# Patient Record
Sex: Male | Born: 1970
Health system: Southern US, Community
[De-identification: ages and names within clinical notes are randomized; demographics above are authoritative.]

## PROBLEM LIST (undated history)

## (undated) DIAGNOSIS — G8929 Other chronic pain: Secondary | ICD-10-CM

## (undated) DIAGNOSIS — J302 Other seasonal allergic rhinitis: Secondary | ICD-10-CM

## (undated) DIAGNOSIS — M549 Dorsalgia, unspecified: Secondary | ICD-10-CM

## (undated) DIAGNOSIS — IMO0001 Reserved for inherently not codable concepts without codable children: Secondary | ICD-10-CM

## (undated) DIAGNOSIS — K219 Gastro-esophageal reflux disease without esophagitis: Secondary | ICD-10-CM

## (undated) DIAGNOSIS — E039 Hypothyroidism, unspecified: Secondary | ICD-10-CM

## (undated) HISTORY — DX: Hypothyroidism, unspecified: E03.9

## (undated) HISTORY — PX: MULTIPLE TOOTH EXTRACTIONS: SHX2053

## (undated) HISTORY — PX: VASECTOMY: SHX75

---

## 2011-07-24 ENCOUNTER — Encounter: Payer: Self-pay | Admitting: Internal Medicine

## 2011-07-24 ENCOUNTER — Ambulatory Visit (INDEPENDENT_AMBULATORY_CARE_PROVIDER_SITE_OTHER): Payer: BC Managed Care – PPO | Admitting: Internal Medicine

## 2011-07-24 ENCOUNTER — Ambulatory Visit: Payer: BC Managed Care – PPO

## 2011-07-24 DIAGNOSIS — R0602 Shortness of breath: Secondary | ICD-10-CM

## 2011-07-24 DIAGNOSIS — R079 Chest pain, unspecified: Secondary | ICD-10-CM

## 2011-07-24 MED ORDER — CYCLOBENZAPRINE HCL 10 MG PO TABS: 10.0000 mg | ORAL_TABLET | Freq: Every day | ORAL | Status: AC

## 2011-07-24 MED ORDER — KETOROLAC TROMETHAMINE 60 MG/2ML IM SOLN
60.0000 mg | Freq: Once | INTRAMUSCULAR | Status: AC
  Administered 2011-07-24: 60 mg via INTRAMUSCULAR

## 2011-07-24 MED ORDER — CYCLOBENZAPRINE HCL 10 MG PO TABS: 10.0000 mg | ORAL_TABLET | Freq: Three times a day (TID) | ORAL | Status: DC | PRN

## 2011-07-24 NOTE — Progress Notes (Signed)
  Subjective:    Patient ID: Jeremy Fair., male    DOB: [birth year], 41 y.o.   MRN: [id number]  HPI 41 yo CM c/o B lower thoracic back pain that occurred suddenly this am when he realized he had forgotten something and jumped quickly out of the car to grab it.  Sudden SOB.  No recent new activity.  Had a similar pain 2 days ago at work but it wasn't nearly as intense.  No recent URI, cough, no dental cleaning.  Denies anterior chest pain.  Denies palpitations or paresthesias. No diaphoresis.  No FH of early cardiac or lung problems. Non-smoker.    Review of Systems  All other systems reviewed and are negative.     Objective:   Physical Exam        Assessment & Plan:

## 2011-07-24 NOTE — Progress Notes (Signed)
  Subjective:    Patient ID: Jeremy Fair., male    DOB: 1970/09/05, 41 y.o.   MRN: [id number]  HPIThis man jumped out of his car to run back in the house to get something early this morning and felt a sudden shortness of breath  With sharp pain in the middle of the back. He continues to notice shortness of breath and can recreate the pain with certain movements No palpitations/no diaphoresis/nausea/no increased work of breathing 2 basic he had a strain injury in this same area of his back     Review of SystemsNoncontributory Past medical history-history of ulcer disease now stable History of erectile dysfunction     Objective:   Physical Exam Apprehensive Vital signs normal including pulse ox 98%Except blood pressure 150/92 HEENT is clear The lungs are clear to auscultation The heart has a regular rhythm with No murmurs rubs or gallops There is tenderness in the posterior chest bilaterally in the muscles around T8-T7     [facility] reading (PRIMARY) by  Dr. Josephina Gip acute distress.No pneumothorax    Assessment & Plan:  Problem #1 shortness of breath Problem #2 chest wall pain Problem #3 muscle strain chest wall  Toradol 60 IM/also he has a history of hives with aspirin he is able to take ibuprofen without any problems  Flexeril 10 mg at bedtime  Tylenol as needed/gentle stretching

## 2011-11-02 ENCOUNTER — Other Ambulatory Visit: Payer: Self-pay | Admitting: Physician Assistant

## 2011-11-02 MED ORDER — SILDENAFIL CITRATE 100 MG PO TABS: ORAL_TABLET | ORAL | Status: DC

## 2011-12-02 ENCOUNTER — Other Ambulatory Visit: Payer: Self-pay | Admitting: *Deleted

## 2011-12-02 MED ORDER — SILDENAFIL CITRATE 100 MG PO TABS: ORAL_TABLET | ORAL | Status: DC

## 2012-03-06 ENCOUNTER — Ambulatory Visit (INDEPENDENT_AMBULATORY_CARE_PROVIDER_SITE_OTHER): Payer: BC Managed Care – PPO | Admitting: Family Medicine

## 2012-03-06 ENCOUNTER — Encounter: Payer: Self-pay | Admitting: Family Medicine

## 2012-03-06 VITALS — BP 118/82 | HR 94 | Temp 99.4°F | Resp 16 | Ht 70.0 in | Wt 205.0 lb

## 2012-03-06 DIAGNOSIS — R079 Chest pain, unspecified: Secondary | ICD-10-CM

## 2012-03-06 DIAGNOSIS — I951 Orthostatic hypotension: Secondary | ICD-10-CM

## 2012-03-06 DIAGNOSIS — R002 Palpitations: Secondary | ICD-10-CM

## 2012-03-06 NOTE — Progress Notes (Signed)
Patient ID: Chalmers Poyser., male   DOB: November 30, 1970, 41 y.o.   MRN: [id number] Jeremy Atkins. is a 41 y.o. male who presents to Urgent Care today for palpitations:  1.  Palpitations:  Present since working out several hours earlier today.  Was on elliptical machine for 30 minutes as he does 3 times a week but when he stepped off he felt his heart rate never returned to normal.  Began feeling bad when he started weight lifting.  Performed several reps and then had to stop because he felt bad.  Went home with girlfriend and sat back in his chair and began experiencing palpitations.  He stood up and experienced some lightheadedness, and at that point he came to UC to be evaluated.    Still having some subjective feelings of palpitations, even during our interview and examination.  States he drinks 2-4 cups of coffee daily but had 6 today.  Had some water while working out and that was all the water or fluid intake he had today.  No chest pain.  No shortness of breath.  No diaphoresis or nausea.    PMH reviewed.  ROS as above otherwise neg.  Medications reviewed.  Viagra lasted, but states he hasn't taken this in almost a year.   Current Outpatient Prescriptions  Medication Sig Dispense Refill  . sildenafil (VIAGRA) 100 MG tablet Take 1/2-1 1 hour prior to stimulation prn  5 tablet  4    Exam:  BP 118/82  Pulse 94  Temp 99.4 F (37.4 C) (Oral)  Resp 16  Ht 5\' 10"  (1.778 m)  Wt 205 lb (92.987 kg)  BMI 29.41 kg/m2  SpO2 98% Gen: Well NAD HEENT: EOMI.  Dry mucus membranes.   Lungs: CTABL Nl WOB Heart: RRR no MRG.  Manual pulse rate 88.   Abd: NABS, NT, ND Exts: Non edematous BL  LE, warm and well perfused.   EKG:  Normal sinus rhythm  ** Please note that "chest pain" was put in as visit diagnosis -- however clinic staff placed that associated diagnosis and not myself.  Patient was NOT having any chest pain.  EKG should have been performed with associated diagnosis of palpitations.     Assessment and Plan:  1.  Palpitations:  Resolved.  No red flags.  I reviewed last visit here, where pulse rate was in 60s and he was hypertensive.  He works out regularly and I suspect he has some relative tachycardia today.   Likely combination of caffeine and decreased fluid intake.  Recommended to increase fluid intake and return if palpitations return, or he begins to experience SOB or chest pain.  Recommended decrease caffeine intake.

## 2012-03-06 NOTE — Patient Instructions (Addendum)
Your EKG looks good.  I think you got dehydrated today.    If your heart starts racing again, come back and let us know.  If you start having chest pain, trouble breathing, or lightheadedness come back and see [place].    It was good to meet you today

## 2012-03-10 NOTE — Progress Notes (Signed)
I have read, reviewed, and agree with plan of care by Dr. Walden  

## 2013-05-01 ENCOUNTER — Ambulatory Visit (INDEPENDENT_AMBULATORY_CARE_PROVIDER_SITE_OTHER): Payer: BC Managed Care – PPO | Admitting: Emergency Medicine

## 2013-05-01 VITALS — BP 130/90 | HR 63 | Temp 98.0°F | Resp 18 | Ht 68.75 in | Wt 215.4 lb

## 2013-05-01 DIAGNOSIS — M549 Dorsalgia, unspecified: Secondary | ICD-10-CM

## 2013-05-01 DIAGNOSIS — S335XXA Sprain of ligaments of lumbar spine, initial encounter: Secondary | ICD-10-CM

## 2013-05-01 LAB — POCT UA - MICROSCOPIC ONLY
Casts, Ur, LPF, POC: NEGATIVE
Epithelial cells, urine per micros: NEGATIVE
Mucus, UA: NEGATIVE
Yeast, UA: NEGATIVE

## 2013-05-01 LAB — POCT URINALYSIS DIPSTICK
Ketones, UA: NEGATIVE
Protein, UA: NEGATIVE
Spec Grav, UA: 1.01
pH, UA: 7

## 2013-05-01 MED ORDER — CYCLOBENZAPRINE HCL 10 MG PO TABS: 10.0000 mg | ORAL_TABLET | Freq: Three times a day (TID) | ORAL | Status: DC | PRN

## 2013-05-01 MED ORDER — TRAMADOL HCL 50 MG PO TABS: 50.0000 mg | ORAL_TABLET | Freq: Three times a day (TID) | ORAL | Status: DC | PRN

## 2013-05-01 NOTE — Patient Instructions (Signed)
Back Pain, Adult Low back pain is very common. About 1 in 5 people have back pain.The cause of low back pain is rarely dangerous. The pain often gets better over time.About half of people with a sudden onset of back pain feel better in just 2 weeks. About 8 in 10 people feel better by 6 weeks.  CAUSES Some common causes of back pain include:  Strain of the muscles or ligaments supporting the spine.  Wear and tear (degeneration) of the spinal discs.  Arthritis.  Direct injury to the back. DIAGNOSIS Most of the time, the direct cause of low back pain is not known.However, back pain can be treated effectively even when the exact cause of the pain is unknown.Answering your caregiver's questions about your overall health and symptoms is one of the most accurate ways to make sure the cause of your pain is not dangerous. If your caregiver needs more information, he or she may order lab work or imaging tests (X-rays or MRIs).However, even if imaging tests show changes in your back, this usually does not require surgery. HOME CARE INSTRUCTIONS For many people, back pain returns.Since low back pain is rarely dangerous, it is often a condition that people can learn to manageon their own.   Remain active. It is stressful on the back to sit or stand in one place. Do not sit, drive, or stand in one place for more than 30 minutes at a time. Take short walks on level surfaces as soon as pain allows.Try to increase the length of time you walk each day.  Do not stay in bed.Resting more than 1 or 2 days can delay your recovery.  Do not avoid exercise or work.Your body is made to move.It is not dangerous to be active, even though your back may hurt.Your back will likely heal faster if you return to being active before your pain is gone.  Pay attention to your body when you bend and lift. Many people have less discomfortwhen lifting if they bend their knees, keep the load close to their bodies,and  avoid twisting. Often, the most comfortable positions are those that put less stress on your recovering back.  Find a comfortable position to sleep. Use a firm mattress and lie on your side with your knees slightly bent. If you lie on your back, put a pillow under your knees.  Only take over-the-counter or prescription medicines as directed by your caregiver. Over-the-counter medicines to reduce pain and inflammation are often the most helpful.Your caregiver may prescribe muscle relaxant drugs.These medicines help dull your pain so you can more quickly return to your normal activities and healthy exercise.  Put ice on the injured area.  Put ice in a plastic bag.  Place a towel between your skin and the bag.  Leave the ice on for 15-20 minutes, 03-04 times a day for the first 2 to 3 days. After that, ice and heat may be alternated to reduce pain and spasms.  Ask your caregiver about trying back exercises and gentle massage. This may be of some benefit.  Avoid feeling anxious or stressed.Stress increases muscle tension and can worsen back pain.It is important to recognize when you are anxious or stressed and learn ways to manage it.Exercise is a great option. SEEK MEDICAL CARE IF:  You have pain that is not relieved with rest or medicine.  You have pain that does not improve in 1 week.  You have new symptoms.  You are generally not feeling well. SEEK   IMMEDIATE MEDICAL CARE IF:   You have pain that radiates from your back into your legs.  You develop new bowel or bladder control problems.  You have unusual weakness or numbness in your arms or legs.  You develop nausea or vomiting.  You develop abdominal pain.  You feel faint. Document Released: 05/10/2005 Document Revised: 11/09/2011 Document Reviewed: 09/28/2010 [employer] Patient Information 2014 [employer].  

## 2013-05-01 NOTE — Progress Notes (Signed)
[facility] [address] [place] [address] [phone]  Date:  05/01/2013   Name:  Jeremy Atkins.   DOB:  16-Jun-1970   MRN:  [id number]  PCP:  Tonye Pearson, MD    Chief Complaint: Back Pain and Headache   History of Present Illness:  Jeremy Atkins. is a 42 y.o. very pleasant male patient who presents with the following:  Sudden onset of left mid back pain when he went to his truck.  Says the pain sent him to his knees.  Over the weekend he helped carry a treadmill up the stairs and had some low back pain following that was not radiating and has resolved.  The pain today is pleuritic and associated with rotation of his upper trunk.  Pain is non radiating.  He has no history of kidney stone and no GU symptoms.  Denies cough, wheezing or shortness of breath.  No hemoptysis.  No nausea or vomiting. Pain is worse with movement and improves when sits quietly.  No fever or chills.  No travel, surgery or immobilization.  Non smoker.  Denies other complaint or health concern today.   There are no active problems to display for this patient.   History reviewed. No pertinent past medical history.  History reviewed. No pertinent past surgical history.  History  Substance Use Topics  . Smoking status: Never Smoker   . Smokeless tobacco: Never Used  . Alcohol Use: No    Family History  Problem Relation Age of Onset  . Hyperlipidemia Mother   . Hyperlipidemia Father     Allergies  Allergen Reactions  . Aspirin Hives    Medication list has been reviewed and updated.  Current Outpatient Prescriptions on File Prior to Visit  Medication Sig Dispense Refill  . sildenafil (VIAGRA) 100 MG tablet Take 1/2-1 1 hour prior to stimulation prn  5 tablet  4   No current facility-administered medications on file prior to visit.    Review of Systems:  As per HPI, otherwise negative.    Physical Examination: Filed Vitals:   05/01/13 1037  BP:  130/90  Pulse: 63  Temp: 98 F (36.7 C)  Resp: 18   Filed Vitals:   05/01/13 1037  Height: 5' 8.75" (1.746 m)  Weight: 215 lb 6.4 oz (97.705 kg)   Body mass index is 32.05 kg/(m^2). Ideal Body Weight: Weight in (lb) to have BMI = 25: 167.7  GEN: WDWN, NAD, Non-toxic, A & O x 3 HEENT: Atraumatic, Normocephalic. Neck supple. No masses, No LAD. Ears and Nose: No external deformity. CV: RRR, No M/G/R. No JVD. No thrill. No extra heart sounds. PULM: CTA B, no wheezes, crackles, rhonchi. No retractions. No resp. distress. No accessory muscle use. ABD: S, NT, ND, +BS. No rebound. No HSM. EXTR: No c/c/e NEURO Normal gait.  PSYCH: Normally interactive. Conversant. Not depressed or anxious appearing.  Calm demeanor.    Assessment and Plan: Back strain Anaprox Flexeril   Signed,  Phillips Odor, MD   Results for orders placed in visit on 05/01/13  POCT URINALYSIS DIPSTICK      Result Value Range   Color, UA light yellow     Clarity, UA clear     Glucose, UA neg     Bilirubin, UA neg     Ketones, UA neg     Spec Grav, UA 1.010     Blood, UA neg     pH, UA 7.0  Protein, UA neg     Urobilinogen, UA 0.2     Nitrite, UA neg     Leukocytes, UA Negative    POCT UA - MICROSCOPIC ONLY      Result Value Range   WBC, Ur, HPF, POC neg     RBC, urine, microscopic neg     Bacteria, U Microscopic neg     Mucus, UA neg     Epithelial cells, urine per micros neg     Crystals, Ur, HPF, POC neg     Casts, Ur, LPF, POC neg     Yeast, UA neg

## 2014-05-13 DIAGNOSIS — F988 Other specified behavioral and emotional disorders with onset usually occurring in childhood and adolescence: Secondary | ICD-10-CM | POA: Insufficient documentation

## 2014-06-05 ENCOUNTER — Encounter: Payer: BC Managed Care – PPO | Admitting: Internal Medicine

## 2014-11-04 ENCOUNTER — Ambulatory Visit (INDEPENDENT_AMBULATORY_CARE_PROVIDER_SITE_OTHER): Payer: 59 | Admitting: Family Medicine

## 2014-11-04 VITALS — BP 118/84 | HR 67 | Temp 98.7°F | Resp 17 | Ht 60.0 in | Wt 213.6 lb

## 2014-11-04 DIAGNOSIS — R05 Cough: Secondary | ICD-10-CM | POA: Diagnosis not present

## 2014-11-04 DIAGNOSIS — H6122 Impacted cerumen, left ear: Secondary | ICD-10-CM

## 2014-11-04 DIAGNOSIS — R059 Cough, unspecified: Secondary | ICD-10-CM

## 2014-11-04 DIAGNOSIS — J209 Acute bronchitis, unspecified: Secondary | ICD-10-CM

## 2014-11-04 MED ORDER — HYDROCODONE-HOMATROPINE 5-1.5 MG/5ML PO SYRP: 5.0000 mL | ORAL_SOLUTION | Freq: Three times a day (TID) | ORAL | Status: DC | PRN

## 2014-11-04 MED ORDER — AZITHROMYCIN 250 MG PO TABS: ORAL_TABLET | ORAL | Status: DC

## 2014-11-04 NOTE — Progress Notes (Signed)
This is a 44 year old generally healthy individual with a nighttime cough for the last 10 days. He had some nighttime sweats 2 nights ago but no definite fever during this time and he's had no further problems with sweats.  Patient has no smoking history and no history of asthma.  Patient says that his cough is somewhat productive at night and does have some sinus congestion. He's also had some mild nausea. He notes his ears have been popping.  Patient works as a Government social research officer.  Objective:BP 118/84 mmHg  Pulse 67  Temp(Src) 98.7 F (37.1 C) (Oral)  Resp 17  Ht 5' (1.524 m)  Wt 213 lb 9.6 oz (96.888 kg)  BMI 41.72 kg/m2  SpO2 98% Gen. appearance is that of a healthy individual in no acute distress. HEENT: Cerumen impaction left ear, otherwise unremarkable Chest: Few rales in the right base otherwise clear Heart: Regular no murmur, no gallop, rhythm regular and regular rate. Extremities: Unremarkable with no edema Skin: No rash  Left ear cerumen was lavaged clear  Assessment: Bronchitis, acute This chart was scribed in my presence and reviewed by me personally.    ICD-9-CM ICD-10-CM   1. Acute bronchitis, unspecified organism 466.0 J20.9 azithromycin (ZITHROMAX Z-PAK) 250 MG tablet     HYDROcodone-homatropine (HYCODAN) 5-1.5 MG/5ML syrup  2. Cough 786.2 R05 HYDROcodone-homatropine (HYCODAN) 5-1.5 MG/5ML syrup  3. Cerumen impaction, left 380.4 H61.22      Signed, Robyn Haber, MD

## 2014-11-04 NOTE — Patient Instructions (Signed)

## 2014-11-06 ENCOUNTER — Telehealth: Payer: Self-pay

## 2014-11-06 NOTE — Telephone Encounter (Signed)
Pt states the medication he was given isn't helping and would like to know if he can get something else or increase the dosage. Please call [phone]     [facility]

## 2014-11-07 MED ORDER — PREDNISONE 20 MG PO TABS: ORAL_TABLET | ORAL | Status: DC

## 2014-11-07 NOTE — Telephone Encounter (Signed)
Assessment: Bronchitis, acute This chart was scribed in my presence and reviewed by me personally.    ICD-9-CM ICD-10-CM   1. Acute bronchitis, unspecified organism 466.0 J20.9 azithromycin (ZITHROMAX Z-PAK) 250 MG tablet     HYDROcodone-homatropine (HYCODAN) 5-1.5 MG/5ML syrup  2. Cough 786.2 R05 HYDROcodone-homatropine (HYCODAN) 5-1.5 MG/5ML syrup  3. Cerumen impaction, left 380.4 H61.22      Signed, Robyn Haber, MD

## 2014-11-07 NOTE — Telephone Encounter (Signed)
Called pt to let him know Rx sent in. Left message on voicemail.

## 2014-12-24 ENCOUNTER — Other Ambulatory Visit: Payer: Self-pay | Admitting: Orthopedic Surgery

## 2014-12-24 DIAGNOSIS — M79671 Pain in right foot: Secondary | ICD-10-CM

## 2014-12-27 ENCOUNTER — Ambulatory Visit
Admission: RE | Admit: 2014-12-27 | Discharge: 2014-12-27 | Disposition: A | Discharge status: Home / Self Care | Payer: 59 | Source: Ambulatory Visit | Attending: Orthopedic Surgery | Admitting: Orthopedic Surgery

## 2014-12-27 DIAGNOSIS — M79671 Pain in right foot: Secondary | ICD-10-CM

## 2015-01-09 ENCOUNTER — Ambulatory Visit: Payer: Worker's Compensation

## 2015-01-09 ENCOUNTER — Ambulatory Visit (INDEPENDENT_AMBULATORY_CARE_PROVIDER_SITE_OTHER): Payer: Worker's Compensation | Admitting: Emergency Medicine

## 2015-01-09 VITALS — BP 112/70 | HR 56 | Temp 98.6°F | Resp 20 | Ht 70.0 in | Wt 214.0 lb

## 2015-01-09 DIAGNOSIS — M545 Low back pain, unspecified: Secondary | ICD-10-CM

## 2015-01-09 DIAGNOSIS — M542 Cervicalgia: Secondary | ICD-10-CM

## 2015-01-09 DIAGNOSIS — S161XXA Strain of muscle, fascia and tendon at neck level, initial encounter: Secondary | ICD-10-CM

## 2015-01-09 MED ORDER — TRAMADOL HCL 50 MG PO TABS
50.0000 mg | ORAL_TABLET | Freq: Three times a day (TID) | ORAL | Status: DC | PRN
Start: 2015-01-09 — End: 2015-01-20

## 2015-01-09 MED ORDER — CYCLOBENZAPRINE HCL 5 MG PO TABS: 5.0000 mg | ORAL_TABLET | Freq: Three times a day (TID) | ORAL | Status: DC | PRN

## 2015-01-09 MED ORDER — NAPROXEN SODIUM 550 MG PO TABS: 550.0000 mg | ORAL_TABLET | Freq: Two times a day (BID) | ORAL | Status: DC

## 2015-01-09 NOTE — Patient Instructions (Signed)
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
## 2015-01-13 ENCOUNTER — Other Ambulatory Visit (HOSPITAL_COMMUNITY): Payer: Self-pay | Admitting: Orthopedic Surgery

## 2015-01-13 ENCOUNTER — Telehealth: Payer: Self-pay

## 2015-01-13 NOTE — Telephone Encounter (Signed)
Patient requesting copy of xpray neck / back [phone]

## 2015-01-14 NOTE — Telephone Encounter (Signed)
Patient picked up x-ray this AM 01/14/15

## 2015-01-21 ENCOUNTER — Other Ambulatory Visit (HOSPITAL_COMMUNITY): Payer: Self-pay | Admitting: Orthopedic Surgery

## 2015-01-21 ENCOUNTER — Ambulatory Visit (HOSPITAL_COMMUNITY)
Admission: RE | Admit: 2015-01-21 | Discharge: 2015-01-21 | Disposition: A | Discharge status: Home / Self Care | Payer: 59 | Source: Ambulatory Visit | Attending: Orthopedic Surgery | Admitting: Orthopedic Surgery

## 2015-01-21 ENCOUNTER — Encounter (HOSPITAL_COMMUNITY): Payer: Self-pay

## 2015-01-21 ENCOUNTER — Encounter (HOSPITAL_COMMUNITY)
Admission: RE | Admit: 2015-01-21 | Discharge: 2015-01-21 | Disposition: A | Discharge status: Home / Self Care | Payer: 59 | Source: Ambulatory Visit | Attending: Orthopedic Surgery | Admitting: Orthopedic Surgery

## 2015-01-21 DIAGNOSIS — S92251A Displaced fracture of navicular [scaphoid] of right foot, initial encounter for closed fracture: Secondary | ICD-10-CM | POA: Diagnosis not present

## 2015-01-21 DIAGNOSIS — Z01812 Encounter for preprocedural laboratory examination: Secondary | ICD-10-CM | POA: Insufficient documentation

## 2015-01-21 DIAGNOSIS — Z01810 Encounter for preprocedural cardiovascular examination: Secondary | ICD-10-CM | POA: Insufficient documentation

## 2015-01-21 DIAGNOSIS — X58XXXA Exposure to other specified factors, initial encounter: Secondary | ICD-10-CM | POA: Insufficient documentation

## 2015-01-21 DIAGNOSIS — Z87891 Personal history of nicotine dependence: Secondary | ICD-10-CM | POA: Diagnosis not present

## 2015-01-21 DIAGNOSIS — Z01818 Encounter for other preprocedural examination: Secondary | ICD-10-CM | POA: Diagnosis not present

## 2015-01-21 DIAGNOSIS — IMO0001 Reserved for inherently not codable concepts without codable children: Secondary | ICD-10-CM

## 2015-01-21 HISTORY — DX: Other seasonal allergic rhinitis: J30.2

## 2015-01-21 HISTORY — DX: Other chronic pain: G89.29

## 2015-01-21 HISTORY — DX: Gastro-esophageal reflux disease without esophagitis: K21.9

## 2015-01-21 HISTORY — DX: Dorsalgia, unspecified: M54.9

## 2015-01-21 HISTORY — DX: Reserved for inherently not codable concepts without codable children: IMO0001

## 2015-01-21 LAB — CBC
HEMATOCRIT: 42.1 % (ref 39.0–52.0)
Hemoglobin: 14.8 g/dL (ref 13.0–17.0)
MCH: 31.7 pg (ref 26.0–34.0)
MCHC: 35.2 g/dL (ref 30.0–36.0)
MCV: 90.1 fL (ref 78.0–100.0)
PLATELETS: 194 10*3/uL (ref 150–400)
RBC: 4.67 MIL/uL (ref 4.22–5.81)
RDW: 12.6 % (ref 11.5–15.5)
WBC: 4.3 10*3/uL (ref 4.0–10.5)

## 2015-01-21 LAB — COMPREHENSIVE METABOLIC PANEL
ALBUMIN: 4.1 g/dL (ref 3.5–5.0)
ALT: 28 U/L (ref 17–63)
AST: 26 U/L (ref 15–41)
Alkaline Phosphatase: 60 U/L (ref 38–126)
Anion gap: 7 (ref 5–15)
BILIRUBIN TOTAL: 0.7 mg/dL (ref 0.3–1.2)
BUN: 15 mg/dL (ref 6–20)
CHLORIDE: 107 mmol/L (ref 101–111)
CO2: 23 mmol/L (ref 22–32)
Calcium: 9.1 mg/dL (ref 8.9–10.3)
Creatinine, Ser: 0.98 mg/dL (ref 0.61–1.24)
GFR calc Af Amer: >60 mL/min (ref >60)
GFR calc non Af Amer: >60 mL/min (ref >60)
GLUCOSE: 103 mg/dL — AB (ref 65–99)
POTASSIUM: 3.8 mmol/L (ref 3.5–5.1)
SODIUM: 137 mmol/L (ref 135–145)
Total Protein: 6.3 g/dL — ABNORMAL LOW (ref 6.5–8.1)

## 2015-01-21 LAB — PROTIME-INR
INR: 1.07 (ref 0.00–1.49)
Prothrombin Time: 14.1 seconds (ref 11.6–15.2)

## 2015-01-21 LAB — SURGICAL PCR SCREEN
MRSA, PCR: POSITIVE — AB
Staphylococcus aureus: POSITIVE — AB

## 2015-01-21 LAB — APTT: APTT: 30 s (ref 24–37)

## 2015-01-21 NOTE — Pre-Procedure Instructions (Addendum)
Ulysses Rivkin Jr.  01/21/2015      [id number] [place], [place] [address] [address] Phone: [phone] Fax: [phone]  [facility] [address], [place] [address] [place] [address] Phone: [phone] Fax: [phone]  [facility] [place] [place] [address] [place] [address] Phone: [phone] Fax: [phone]    Your procedure is scheduled on Friday, January 24, 2015  Report to [facility] Admitting at 10:15 A.M.  Call this number if you have problems the morning of surgery:  [phone]   Remember:  Do not eat food or drink liquids after midnight Thursday, January 23, 2015  Take these medicines the morning of surgery with A SIP OF WATER: if needed: [place]  Stop taking Aspirin, vitamins and herbal medications. Do not take any NSAIDs ie: Ibuprofen, Advil, Naproxen or any medication containing Aspirin; stop now.  Do not wear jewelry, make-up or nail polish.  Do not wear lotions, powders, or perfumes.  You may not wear deodorant.  Do not shave 48 hours prior to surgery.  Men may shave face and neck.  Do not bring valuables to the hospital.  [facility] is not responsible for any belongings or valuables.  Contacts, dentures or bridgework may not be worn into surgery.  Leave your suitcase in the car.  After surgery it may be brought to your room.  For patients admitted to the hospital, discharge time will be determined by your treatment team.  Patients discharged the day of surgery will not be allowed to drive home.   Name and phone number of your driver:   Special instructions:  Special Instructions:Special Instructions: [facility] - Preparing for Surgery  Before surgery, you can play an important role.  Because skin is not sterile, your skin needs to be as free of germs as possible.  You can reduce the  number of germs on you skin by washing with CHG (chlorahexidine gluconate) soap before surgery.  CHG is an antiseptic cleaner which kills germs and bonds with the skin to continue killing germs even after washing.  Please DO NOT use if you have an allergy to CHG or antibacterial soaps.  If your skin becomes reddened/irritated stop using the CHG and inform your nurse when you arrive at Short Stay.  Do not shave (including legs and underarms) for at least 48 hours prior to the first CHG shower.  You may shave your face.  Please follow these instructions carefully:   1.  Shower with CHG Soap the night before surgery and the morning of Surgery.  2.  If you choose to wash your hair, wash your hair first as usual with your normal shampoo.  3.  After you shampoo, rinse your hair and body thoroughly to remove the Shampoo.  4.  Use CHG as you would any other liquid soap.  You can apply chg directly  to the skin and wash gently with scrungie or a clean washcloth.  5.  Apply the CHG Soap to your body ONLY FROM THE NECK DOWN.  Do not use on open wounds or open sores.  Avoid contact with your eyes, ears, mouth and genitals (private parts).  Wash genitals (private parts) with your normal soap.  6.  Wash thoroughly, paying special attention to the area where your surgery will be performed.  7.  Thoroughly rinse your body  with warm water from the neck down.  8.  DO NOT shower/wash with your normal soap after using and rinsing off the CHG Soap.  9.  Pat yourself dry with a clean towel.            10.  Wear clean pajamas.            11.  Place clean sheets on your bed the night of your first shower and do not sleep with pets.  Day of Surgery  Do not apply any lotions/deodorants the morning of surgery.  Please wear clean clothes to the hospital/surgery center.  Please read over the following fact sheets that you were given. Pain Booklet, Coughing and Deep Breathing, MRSA Information and Surgical Site Infection  Prevention

## 2015-01-21 NOTE — Progress Notes (Signed)
Pt denies SOB, chest pain, and being under the care of a cardiologist. Pt denies having a stress test, echo and cardiac cath. Pt denies having a chest x ray and EKG within the last year. 

## 2015-01-24 ENCOUNTER — Ambulatory Visit (HOSPITAL_COMMUNITY): Payer: 59 | Admitting: Certified Registered Nurse Anesthetist

## 2015-01-24 ENCOUNTER — Ambulatory Visit (HOSPITAL_COMMUNITY)
Admission: RE | Admit: 2015-01-24 | Discharge: 2015-01-24 | Disposition: A | Discharge status: Home / Self Care | Payer: 59 | Source: Ambulatory Visit | Attending: Orthopedic Surgery | Admitting: Orthopedic Surgery

## 2015-01-24 ENCOUNTER — Encounter (HOSPITAL_COMMUNITY): Payer: Self-pay | Admitting: *Deleted

## 2015-01-24 ENCOUNTER — Encounter (HOSPITAL_COMMUNITY)
Admission: RE | Disposition: A | Discharge status: Home / Self Care | Payer: Self-pay | Source: Ambulatory Visit | Attending: Orthopedic Surgery

## 2015-01-24 DIAGNOSIS — M84474A Pathological fracture, right foot, initial encounter for fracture: Secondary | ICD-10-CM | POA: Insufficient documentation

## 2015-01-24 DIAGNOSIS — S92251P Displaced fracture of navicular [scaphoid] of right foot, subsequent encounter for fracture with malunion: Secondary | ICD-10-CM

## 2015-01-24 HISTORY — PX: ANKLE FUSION: SHX5718

## 2015-01-24 SURGERY — ANKLE FUSION
Anesthesia: Monitor Anesthesia Care | Site: Foot | Laterality: Right

## 2015-01-24 MED ORDER — PROPOFOL 10 MG/ML IV BOLUS
INTRAVENOUS | Status: AC
  Filled 2015-01-24: qty 20

## 2015-01-24 MED ORDER — CHLORHEXIDINE GLUCONATE 4 % EX LIQD: 60.0000 mL | Freq: Once | CUTANEOUS | Status: DC

## 2015-01-24 MED ORDER — BUPIVACAINE-EPINEPHRINE (PF) 0.5% -1:200000 IJ SOLN
INTRAMUSCULAR | Status: DC | PRN
  Administered 2015-01-24: 50 mL

## 2015-01-24 MED ORDER — ACETAMINOPHEN 325 MG PO TABS: 325.0000 mg | ORAL_TABLET | ORAL | Status: DC | PRN

## 2015-01-24 MED ORDER — OXYCODONE HCL 5 MG PO TABS: 5.0000 mg | ORAL_TABLET | Freq: Once | ORAL | Status: DC | PRN

## 2015-01-24 MED ORDER — MIDAZOLAM HCL 2 MG/2ML IJ SOLN
INTRAMUSCULAR | Status: AC
  Filled 2015-01-24: qty 2

## 2015-01-24 MED ORDER — PROPOFOL 10 MG/ML IV BOLUS
INTRAVENOUS | Status: DC | PRN
  Administered 2015-01-24: 200 mg via INTRAVENOUS

## 2015-01-24 MED ORDER — MIDAZOLAM HCL 2 MG/2ML IJ SOLN
INTRAMUSCULAR | Status: AC
  Filled 2015-01-24: qty 4

## 2015-01-24 MED ORDER — FENTANYL CITRATE (PF) 100 MCG/2ML IJ SOLN
INTRAMUSCULAR | Status: DC | PRN
  Administered 2015-01-24: 100 ug via INTRAVENOUS

## 2015-01-24 MED ORDER — OXYCODONE-ACETAMINOPHEN 5-325 MG PO TABS: 1.0000 | ORAL_TABLET | ORAL | Status: DC | PRN

## 2015-01-24 MED ORDER — LACTATED RINGERS IV SOLN
INTRAVENOUS | Status: DC
  Administered 2015-01-24: 12:00:00 via INTRAVENOUS

## 2015-01-24 MED ORDER — ROCURONIUM BROMIDE 50 MG/5ML IV SOLN
INTRAVENOUS | Status: AC
  Filled 2015-01-24: qty 1

## 2015-01-24 MED ORDER — ACETAMINOPHEN 160 MG/5ML PO SOLN
325.0000 mg | ORAL | Status: DC | PRN
  Filled 2015-01-24: qty 20.3

## 2015-01-24 MED ORDER — ONDANSETRON HCL 4 MG/2ML IJ SOLN
INTRAMUSCULAR | Status: AC
  Filled 2015-01-24: qty 2

## 2015-01-24 MED ORDER — 0.9 % SODIUM CHLORIDE (POUR BTL) OPTIME
TOPICAL | Status: DC | PRN
  Administered 2015-01-24: 1000 mL

## 2015-01-24 MED ORDER — CEFAZOLIN SODIUM-DEXTROSE 2-3 GM-% IV SOLR
2.0000 g | INTRAVENOUS | Status: AC
  Administered 2015-01-24: 2 g via INTRAVENOUS
  Filled 2015-01-24: qty 50

## 2015-01-24 MED ORDER — OXYCODONE HCL 5 MG/5ML PO SOLN: 5.0000 mg | Freq: Once | ORAL | Status: DC | PRN

## 2015-01-24 MED ORDER — ARTIFICIAL TEARS OP OINT
TOPICAL_OINTMENT | OPHTHALMIC | Status: AC
  Filled 2015-01-24: qty 3.5

## 2015-01-24 MED ORDER — MUPIROCIN 2 % EX OINT
TOPICAL_OINTMENT | CUTANEOUS | Status: AC
  Filled 2015-01-24: qty 22

## 2015-01-24 MED ORDER — FENTANYL CITRATE (PF) 250 MCG/5ML IJ SOLN
INTRAMUSCULAR | Status: AC
  Filled 2015-01-24: qty 5

## 2015-01-24 MED ORDER — FENTANYL CITRATE (PF) 100 MCG/2ML IJ SOLN
INTRAMUSCULAR | Status: AC
  Filled 2015-01-24: qty 2

## 2015-01-24 MED ORDER — MUPIROCIN 2 % EX OINT: 1.0000 "application " | TOPICAL_OINTMENT | Freq: Once | CUTANEOUS | Status: DC

## 2015-01-24 MED ORDER — MIDAZOLAM HCL 5 MG/5ML IJ SOLN
INTRAMUSCULAR | Status: DC | PRN
  Administered 2015-01-24 (×2): 2 mg via INTRAVENOUS

## 2015-01-24 MED ORDER — HYDROMORPHONE HCL 1 MG/ML IJ SOLN: 0.2500 mg | INTRAMUSCULAR | Status: DC | PRN

## 2015-01-24 MED ORDER — PROPOFOL INFUSION 10 MG/ML OPTIME
INTRAVENOUS | Status: DC | PRN
  Administered 2015-01-24: 50 ug/kg/min via INTRAVENOUS

## 2015-01-24 MED ORDER — PROPOFOL 10 MG/ML IV BOLUS
INTRAVENOUS | Status: AC
Start: 2015-01-24 — End: 2015-01-24
  Filled 2015-01-24: qty 20

## 2015-01-24 SURGICAL SUPPLY — 42 items
BANDAGE ESMARK 6X9 LF (GAUZE/BANDAGES/DRESSINGS) ×1 IMPLANT
BIT DRILL CANN 3.2 (BIT) ×1 IMPLANT
BLADE SAW SGTL HD 18.5X60.5X1. (BLADE) ×2 IMPLANT
BLADE SURG 10 STRL SS (BLADE) IMPLANT
BNDG COHESIVE 4X5 TAN STRL (GAUZE/BANDAGES/DRESSINGS) ×2 IMPLANT
BNDG COHESIVE 6X5 TAN STRL LF (GAUZE/BANDAGES/DRESSINGS) ×2 IMPLANT
BNDG ESMARK 6X9 LF (GAUZE/BANDAGES/DRESSINGS) ×2
BNDG GAUZE ELAST 4 BULKY (GAUZE/BANDAGES/DRESSINGS) ×2 IMPLANT
COVER MAYO STAND STRL (DRAPES) IMPLANT
COVER SURGICAL LIGHT HANDLE (MISCELLANEOUS) ×4 IMPLANT
DRAPE OEC MINIVIEW 54X84 (DRAPES) IMPLANT
DRAPE U-SHAPE 47X51 STRL (DRAPES) ×2 IMPLANT
DRILL BIT CANN 3.2 (BIT) ×2
DRSG ADAPTIC 3X8 NADH LF (GAUZE/BANDAGES/DRESSINGS) ×2 IMPLANT
DURAPREP 26ML APPLICATOR (WOUND CARE) ×2 IMPLANT
ELECT REM PT RETURN 9FT ADLT (ELECTROSURGICAL) ×2
ELECTRODE REM PT RTRN 9FT ADLT (ELECTROSURGICAL) ×1 IMPLANT
GAUZE SPONGE 4X4 12PLY STRL (GAUZE/BANDAGES/DRESSINGS) ×2 IMPLANT
GLOVE BIOGEL PI IND STRL 9 (GLOVE) ×1 IMPLANT
GLOVE BIOGEL PI INDICATOR 9 (GLOVE) ×1
GLOVE SURG ORTHO 9.0 STRL STRW (GLOVE) ×2 IMPLANT
GOWN STRL REUS W/ TWL LRG LVL3 (GOWN DISPOSABLE) ×1 IMPLANT
GOWN STRL REUS W/ TWL XL LVL3 (GOWN DISPOSABLE) ×1 IMPLANT
GOWN STRL REUS W/TWL LRG LVL3 (GOWN DISPOSABLE) ×1
GOWN STRL REUS W/TWL XL LVL3 (GOWN DISPOSABLE) ×1
GUIDEWIRE NON THREAD 1.6MM (WIRE) ×4 IMPLANT
KIT BASIN OR (CUSTOM PROCEDURE TRAY) ×2 IMPLANT
KIT ROOM TURNOVER OR (KITS) ×2 IMPLANT
NS IRRIG 1000ML POUR BTL (IV SOLUTION) ×2 IMPLANT
PACK ORTHO EXTREMITY (CUSTOM PROCEDURE TRAY) ×2 IMPLANT
PAD ARMBOARD 7.5X6 YLW CONV (MISCELLANEOUS) ×4 IMPLANT
PUTTY BONE DBX 5CC MIX (Putty) ×2 IMPLANT
SCREW COMPR HDLS ST 4.5X40 (Screw) ×2 IMPLANT
SCREW HEADLESS CANN 4.5X56MM (Screw) ×2 IMPLANT
SPONGE GAUZE 4X4 12PLY STER LF (GAUZE/BANDAGES/DRESSINGS) ×2 IMPLANT
SPONGE LAP 18X18 X RAY DECT (DISPOSABLE) ×2 IMPLANT
SUCTION FRAZIER TIP 10 FR DISP (SUCTIONS) ×2 IMPLANT
SUT ETHILON 2 0 PSLX (SUTURE) ×6 IMPLANT
TOWEL OR 17X24 6PK STRL BLUE (TOWEL DISPOSABLE) ×2 IMPLANT
TOWEL OR 17X26 10 PK STRL BLUE (TOWEL DISPOSABLE) ×2 IMPLANT
TUBE CONNECTING 12X1/4 (SUCTIONS) ×2 IMPLANT
WATER STERILE IRR 1000ML POUR (IV SOLUTION) ×2 IMPLANT

## 2015-01-24 NOTE — Transfer of Care (Signed)
Immediate Anesthesia Transfer of Care Note  Patient: Jeremy Atkins.  Procedure(s) Performed: Procedure(s): Excision Navicular Fragment and Fusion Talonavicular Joint (Right)  Patient Location: PACU  Anesthesia Type:General  Level of Consciousness: awake, alert  and oriented  Airway & Oxygen Therapy: Patient Spontanous Breathing and Patient connected to nasal cannula oxygen  Post-op Assessment: Report given to RN and Post -op Vital signs reviewed and stable  Post vital signs: Reviewed and stable  Last Vitals:  Filed Vitals:   01/24/15 1017  BP: 121/78  Pulse: 58  Temp: 36.9 C  Resp: 20    Complications: No apparent anesthesia complications

## 2015-01-24 NOTE — Progress Notes (Signed)
Orthopedic Tech Progress Note Patient Details:  Jeremy Atkins. [birth year] [id number] Applied CAM walker to RLE.  Fit pt. for crutches and taught use of same. Ortho Devices Type of Ortho Device: CAM walker, Crutches Ortho Device/Splint Location: RLE Ortho Device/Splint Interventions: Application   Darrol Poke 01/24/2015, 3:05 PM

## 2015-01-24 NOTE — Op Note (Signed)
01/24/2015  1:30 PM  PATIENT:  Jeremy Atkins.    PRE-OPERATIVE DIAGNOSIS:  Chronic Navicular Fracture Nonunion right  POST-OPERATIVE DIAGNOSIS:  Same  PROCEDURE:  Excision Navicular Fragment and Fusion Talonavicular Joint  SURGEON:  Newt Minion, MD  PHYSICIAN ASSISTANT:None ANESTHESIA:   General  PREOPERATIVE INDICATIONS:  Jeremy Atkins. is a  44 y.o. male with a diagnosis of Chronic Navicular Fracture Nonunion who failed conservative measures and elected for surgical management.    The risks benefits and alternatives were discussed with the patient preoperatively including but not limited to the risks of infection, bleeding, nerve injury, cardiopulmonary complications, the need for revision surgery, among others, and the patient was willing to proceed.  OPERATIVE IMPLANTS: 4.5 headless canulated screws  OPERATIVE FINDINGS: cystic bony changes  OPERATIVE PROCEDURE: Patient was brought to the operating room after undergoing a popliteal block he then underwent a general and aesthetic. After adequate levels anesthesia were obtained patient's right lower extremity was prepped using DuraPrep draped into a sterile field. A timeout was called. A dorsal midline incision was made in line with the second metatarsal. Blunt dissection was carried down to the dorsalis pedis vascular bundle. This bundle was tented from the bony fragments. With subperiosteal dissection this was elevated off the necrotic bony fragments and was retracted laterally. The fragments of the navicular were removed. Using a saw and a curet the edges of the talar head and navicular were debrided of articular cartilage. The bones were aligned and the void was filled with cancellus bone graft with demineralized bone matrix. K wires were then used to align the navicular to the talus and the first screw was placed from the talus to the navicular into the medial cuneiform to provide stable alignment of the medial column. A  second screw was then placed from the talus into the navicular. C-arm floss be verified alignment. The wound was irrigated with normal saline throughout the case. The incision was closed using 2-0 nylon. A sterile compressive dressing was applied. Patient was extubated taken to the PACU in stable condition.

## 2015-01-24 NOTE — Anesthesia Procedure Notes (Addendum)
Anesthesia Regional Block:  Popliteal block  Pre-Anesthetic Checklist: ,, timeout performed, Correct Patient, Correct Site, Correct Laterality, Correct Procedure, Correct Position, site marked, Risks and benefits discussed, Surgical consent,  Pre-op evaluation,  Post-op pain management  Laterality: Right  Prep: chloraprep       Needles:  Injection technique: Single-shot  Needle Type: Stimiplex     Needle Length: 10cm 10 cm Needle Gauge: 21 and 21 G    Additional Needles:  Procedures: ultrasound guided (picture in chart)  Motor weakness within 5 minutes. Popliteal block Narrative:  Injection made incrementally with aspirations every 5 mL.  Performed by: Personally  Anesthesiologist: Nolon Nations  Additional Notes: Nerve located and needle positioned with direct ultrasound guidance. Good perineural spread. Patient tolerated well.   Procedure Name: LMA Insertion Date/Time: 01/24/2015 1:00 PM Performed by: [place] Pink Pre-anesthesia Checklist: Patient identified, Emergency Drugs available, Suction available, Timeout performed and Patient being monitored Patient Re-evaluated:Patient Re-evaluated prior to inductionOxygen Delivery Method: Simple face mask Intubation Type: IV induction LMA: LMA inserted LMA Size: 5.0 Number of attempts: 1 Placement Confirmation: positive ETCO2 and breath sounds checked- equal and bilateral Dental Injury: Teeth and Oropharynx as per pre-operative assessment
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
## 2015-01-24 NOTE — Anesthesia Preprocedure Evaluation (Signed)
Anesthesia Evaluation  Patient identified by MRN, date of birth, ID band Patient awake    Reviewed: Allergy & Precautions, NPO status , Patient's Chart, lab work & pertinent test results  History of Anesthesia Complications Negative for: history of anesthetic complications  Airway Mallampati: II  TM Distance: >3 FB Neck ROM: Full    Dental  (+) Teeth Intact   Pulmonary neg pulmonary ROS,  breath sounds clear to auscultation        Cardiovascular negative cardio ROS  Rhythm:Regular     Neuro/Psych negative neurological ROS  negative psych ROS   GI/Hepatic Neg liver ROS, GERD-  Controlled,  Endo/Other  negative endocrine ROS  Renal/GU negative Renal ROS     Musculoskeletal Navicular fracture right   Abdominal   Peds  Hematology negative hematology ROS (+)   Anesthesia Other Findings   Reproductive/Obstetrics                             Anesthesia Physical Anesthesia Plan  ASA: I  Anesthesia Plan: MAC and Regional   Post-op Pain Management:    Induction: Intravenous  Airway Management Planned: Nasal Cannula  Additional Equipment: None  Intra-op Plan:   Post-operative Plan:   Informed Consent: I have reviewed the patients History and Physical, chart, labs and discussed the procedure including the risks, benefits and alternatives for the proposed anesthesia with the patient or authorized representative who has indicated his/her understanding and acceptance.   Dental advisory given  Plan Discussed with: CRNA and Surgeon  Anesthesia Plan Comments:         Anesthesia Quick Evaluation

## 2015-01-24 NOTE — OR Nursing (Addendum)
Esmark used as tourniquet on right lower extremity: on @ 1302, off @ 1327

## 2015-01-28 ENCOUNTER — Encounter (HOSPITAL_COMMUNITY): Payer: Self-pay | Admitting: Orthopedic Surgery

## 2015-01-28 NOTE — Anesthesia Postprocedure Evaluation (Signed)
  Anesthesia Post-op Note  Patient: Jeremy Atkins.  Procedure(s) Performed: Procedure(s): Excision Navicular Fragment and Fusion Talonavicular Joint (Right)  Patient Location: PACU  Anesthesia Type:General and Regional  Level of Consciousness: awake  Airway and Oxygen Therapy: Patient Spontanous Breathing  Post-op Pain: none  Post-op Assessment: Post-op Vital signs reviewed, Patient's Cardiovascular Status Stable, Respiratory Function Stable, Patent Airway, No signs of Nausea or vomiting and Pain level controlled              Post-op Vital Signs: Reviewed and stable  Last Vitals:  Filed Vitals:   01/24/15 1456  BP: 120/82  Pulse: 59  Temp:   Resp:     Complications: No apparent anesthesia complications

## 2015-03-21 ENCOUNTER — Ambulatory Visit (INDEPENDENT_AMBULATORY_CARE_PROVIDER_SITE_OTHER): Payer: 59 | Admitting: Internal Medicine

## 2015-03-21 ENCOUNTER — Telehealth: Payer: Self-pay

## 2015-03-21 ENCOUNTER — Encounter: Payer: Self-pay | Admitting: Internal Medicine

## 2015-03-21 VITALS — BP 133/87 | HR 65 | Temp 98.3°F | Resp 16 | Ht 70.0 in | Wt 213.0 lb

## 2015-03-21 DIAGNOSIS — F900 Attention-deficit hyperactivity disorder, predominantly inattentive type: Secondary | ICD-10-CM

## 2015-03-21 DIAGNOSIS — F988 Other specified behavioral and emotional disorders with onset usually occurring in childhood and adolescence: Secondary | ICD-10-CM

## 2015-03-21 DIAGNOSIS — Z23 Encounter for immunization: Secondary | ICD-10-CM | POA: Diagnosis not present

## 2015-03-21 MED ORDER — AMPHETAMINE-DEXTROAMPHETAMINE 10 MG PO TABS: 10.0000 mg | ORAL_TABLET | Freq: Two times a day (BID) | ORAL | Status: DC

## 2015-03-21 NOTE — Patient Instructions (Signed)
ADD for Dummies

## 2015-03-21 NOTE — Telephone Encounter (Signed)
Patient states see by dr Laney Pastor today and rx adderall was prescibed. Per patient pharmacy has advised this requires authorization  Pt is requesting we contact his insurance comp to get approval

## 2015-03-23 NOTE — Progress Notes (Signed)
   Subjective:    Patient ID: Jeremy Del., male    DOB: Nov 10, 1970, 44 y.o.   MRN: [id number]  HPI  Chief Complaint  Patient presents with  . attention problems   Has had another promotion at work and has reached a point where he is overwhelmed with details, completely distractible by the noise around him, unable to finish task, procrastinating starting projects, not finishing projects well, not attending to the details of orders when he gets distracted. He is starting to get negative feedback at work after several years of doing very well. History of attention deficit disorder diagnosed in elementary school. He was treated on and off throughout his academic career but in general did poorly and never took medication for long. Never attended counseling. No college degree. Both kids ADD  See ASRS in chart--very positive   Patient Active Problem List   Diagnosis---none Date Noted  Meds --none Describes no other medical problems Relationship stable Actually enjoys his work environment otherwise  Review of Systems  Constitutional: Negative for activity change, appetite change and unexpected weight change.  HENT: Negative for hearing loss.   Eyes: Negative for visual disturbance.  Cardiovascular: Negative for chest pain and palpitations.  Neurological: Negative for speech difficulty and headaches.  Psychiatric/Behavioral: Negative for hallucinations, confusion, sleep disturbance and dysphoric mood.       Objective:   Physical Exam  Constitutional: He is oriented to person, place, and time. He appears well-developed and well-nourished. No distress.  HENT:  Head: Normocephalic and atraumatic.  Eyes: Pupils are equal, round, and reactive to light.  Neck: Normal range of motion.  Cardiovascular: Normal rate and regular rhythm.   Pulmonary/Chest: Effort normal. No respiratory distress.  Musculoskeletal: Normal range of motion.  Neurological: He is alert and oriented to  person, place, and time.  Skin: Skin is warm and dry.  Psychiatric: He has a normal mood and affect. His behavior is normal.  Nursing note and vitals reviewed.  BP 133/87 mmHg  Pulse 65  Temp(Src) 98.3 F (36.8 C)  Resp 16  Ht 5\' 10"  (1.778 m)  Wt 213 lb (96.616 kg)  BMI 30.56 kg/m2        Assessment & Plan:  ADD (attention deficit disorder) without hyperactivity  Need for immunization against influenza - Plan: Flu Vaccine QUAD 36+ mos IM (Fluarix)  Meds ordered this encounter  Medications  . amphetamine-dextroamphetamine (ADDERALL) 10 MG tablet    Sig: Take 1 tablet (10 mg total) by mouth 2 (two) times daily.    Dispense:  60 tablet    Refill:  0   03/08/19 and f/u 3w SE disc Add for dummies

## 2015-03-24 ENCOUNTER — Telehealth: Payer: Self-pay

## 2015-03-24 NOTE — Telephone Encounter (Signed)
This is patient second call, pt called Friday to advised an authorization would be needed from ins for prescribed RX by dr Laney Pastor on 03/21/15 Pt is calling to check the status of the authorization

## 2015-03-24 NOTE — Telephone Encounter (Signed)
PA completed on covermymeds. Pending. 

## 2015-03-25 NOTE — Telephone Encounter (Signed)
Prior Jeremy Atkins has already been completed (see notes under 10/28 message) and awaiting decision from ins co. Explained the process to pt.

## 2015-03-26 NOTE — Telephone Encounter (Signed)
PA approved through 03/23/16. Notified pharm and pt approved.

## 2015-04-16 ENCOUNTER — Ambulatory Visit: Payer: Self-pay | Admitting: Internal Medicine

## 2015-04-21 ENCOUNTER — Encounter: Payer: Self-pay | Admitting: Internal Medicine

## 2015-04-23 ENCOUNTER — Ambulatory Visit: Payer: Self-pay | Admitting: Internal Medicine

## 2015-04-30 ENCOUNTER — Ambulatory Visit: Payer: 59 | Attending: Orthopedic Surgery | Admitting: Physical Therapy

## 2015-04-30 DIAGNOSIS — M25671 Stiffness of right ankle, not elsewhere classified: Secondary | ICD-10-CM | POA: Diagnosis present

## 2015-04-30 DIAGNOSIS — M259 Joint disorder, unspecified: Secondary | ICD-10-CM | POA: Diagnosis not present

## 2015-04-30 DIAGNOSIS — R269 Unspecified abnormalities of gait and mobility: Secondary | ICD-10-CM | POA: Diagnosis present

## 2015-04-30 DIAGNOSIS — R29898 Other symptoms and signs involving the musculoskeletal system: Secondary | ICD-10-CM
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
## 2015-04-30 NOTE — Patient Instructions (Signed)
ROM: Plantar / Dorsiflexion    With left leg relaxed, gently flex and extend ankle. Move through full range of motion. Avoid pain. Repeat _10___ times per set. Do _1__ sets per session. Do _5___ sessions per day.  http://orth.exer.us/34   Copyright  VHI. All rights reserved.  Toe Curl: Unilateral    With right foot resting on towel, slowly bunch up towel by curling toes. Repeat _10___ times per set. Do ___1_ sets per session. Do __1__ sessions per day.  http://orth.exer.us/18   Copyright  VHI. All rights reserved.  ROM: Inversion / Eversion    With left leg relaxed, gently turn ankle and foot in and out. Move through full range of motion. Avoid pain. Repeat __10__ times per set. Do __1__ sets per session. Do _5___ sessions per day.  http://orth.exer.us/36   Copyright  VHI. All rights reserved.  Scar Massage  Scar massage is done to improve the mobility of scar, decrease scar tissue from building up, reduce adhesions, and prevent Keloids from forming. Start scar massage after scabs have fallen off by themselves and no open areas. The first few weeks after surgery, it is normal for a scar to appear pink or red and slightly raised. Scars can itch or have areas of numbness. Some scars may be sensitive.   Direct Scar massage: after scar is healed, no opening, no scab 1.  Place pads of two fingers together directly on the scar starting at one end of the scar. Move the fingers up and down across the scar holding 5 seconds one direction.  Then go opposite direction hold 5 seconds.  2. Move over to the next section of the scar and repeat.  Work your way along the entire length of the scar.   3. Next make diagonal movements along the scar holding 5 seconds at one direction. 4. Next movement is side to side. 5. Do not rub fingers over the scar.  Instead keep firm pressure and move scar over the tissue it is on top   Scar Lift and Roll 12 weeks after surgery. 1. Pinch a small amount  of the scar between your first two fingers and thumb.  2. Roll the scar between your fingers for 5 to 15 seconds. 3. Move along the scar and repeat until you have massaged the entire length of scar.   Stop the massage and call your doctor if you notice: 1. Increased redness 2. Bleeding from scar 3. Seepage coming from the scar 4. Scar is warmer and has increased pain     Toe Raise (Standing)    Rock back on heels. Repeat _10___ times per set. Do __1__ sets per session. Do _1___ sessions per day.  http://orth.exer.us/42   Copyright  VHI. All rights reserved.  Heel Raise: Bilateral (Standing)    Rise on balls of feet. Lean on counter.  Repeat __10__ times per set. Do __1__ sets per session. Do __1__ sessions per day. 10x knee straight, 10 knees bent http://orth.exer.us/38   Copyright  VHI. All rights reserved.   Gastroc Stretch    Stand with right foot back, leg straight, forward leg bent. Keeping heel on floor, turned slightly out, lean into wall until stretch is felt in calf. Hold _30___ seconds. Repeat __2__ times per set. Do __1__ sets per session. Do __2__ sessions per day.  http://orth.exer.us/26   Copyright  VHI. All rights reserved.  Soleus Stretch    Stand with right foot back, both knees bent. Keeping heel on floor, turned slightly  out, lean into wall until stretch is felt in lower calf. Hold _30___ seconds. Repeat _2___ times per set. Do _1___ sets per session. Do ____ sessions per day. 2 http://orth.exer.us/24   Copyright  VHI. All rights reserved.  [facility] [address] [place], [place] [address] Phone # [phone] Fax [phone]

## 2015-05-06 ENCOUNTER — Encounter: Payer: Self-pay | Admitting: Physical Therapy

## 2015-05-06 ENCOUNTER — Ambulatory Visit: Payer: 59 | Admitting: Physical Therapy

## 2015-05-06 DIAGNOSIS — R29898 Other symptoms and signs involving the musculoskeletal system: Secondary | ICD-10-CM

## 2015-05-06 DIAGNOSIS — M25671 Stiffness of right ankle, not elsewhere classified: Secondary | ICD-10-CM

## 2015-05-06 DIAGNOSIS — R269 Unspecified abnormalities of gait and mobility: Secondary | ICD-10-CM

## 2015-05-06 DIAGNOSIS — M259 Joint disorder, unspecified: Secondary | ICD-10-CM | POA: Diagnosis not present

## 2015-05-06 NOTE — Patient Instructions (Signed)
Soleus Stretch    Stand with right foot back, both knees bent. Keeping heel on floor, turned slightly out, lean into wall until stretch is felt in lower calf. Hold ____ seconds. Repeat __3__ times per set. Do  2-3 sessions per day.  http://orth.exer.us/24   Copyright  VHI. All rights reserved.
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
## 2015-05-14 ENCOUNTER — Encounter: Payer: Self-pay | Admitting: Physical Therapy

## 2015-05-14 ENCOUNTER — Ambulatory Visit: Payer: 59 | Admitting: Physical Therapy

## 2015-05-14 DIAGNOSIS — R269 Unspecified abnormalities of gait and mobility: Secondary | ICD-10-CM

## 2015-05-14 DIAGNOSIS — M259 Joint disorder, unspecified: Secondary | ICD-10-CM | POA: Diagnosis not present

## 2015-05-14 DIAGNOSIS — R29898 Other symptoms and signs involving the musculoskeletal system: Secondary | ICD-10-CM

## 2015-05-14 DIAGNOSIS — M25671 Stiffness of right ankle, not elsewhere classified: Secondary | ICD-10-CM
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
## 2015-05-15 ENCOUNTER — Encounter: Payer: Self-pay | Admitting: Rehabilitation

## 2015-05-15 ENCOUNTER — Ambulatory Visit: Payer: 59 | Admitting: Rehabilitation

## 2015-05-15 DIAGNOSIS — M25671 Stiffness of right ankle, not elsewhere classified: Secondary | ICD-10-CM

## 2015-05-15 DIAGNOSIS — R269 Unspecified abnormalities of gait and mobility: Secondary | ICD-10-CM

## 2015-05-15 DIAGNOSIS — M259 Joint disorder, unspecified: Secondary | ICD-10-CM | POA: Diagnosis not present

## 2015-05-15 DIAGNOSIS — R29898 Other symptoms and signs involving the musculoskeletal system: Secondary | ICD-10-CM
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
## 2015-05-21 ENCOUNTER — Ambulatory Visit: Payer: 59 | Admitting: Physical Therapy

## 2015-05-21 ENCOUNTER — Encounter: Payer: Self-pay | Admitting: Physical Therapy

## 2015-05-21 DIAGNOSIS — M25671 Stiffness of right ankle, not elsewhere classified: Secondary | ICD-10-CM

## 2015-05-21 DIAGNOSIS — R269 Unspecified abnormalities of gait and mobility: Secondary | ICD-10-CM

## 2015-05-21 DIAGNOSIS — R29898 Other symptoms and signs involving the musculoskeletal system: Secondary | ICD-10-CM

## 2015-05-21 DIAGNOSIS — M259 Joint disorder, unspecified: Secondary | ICD-10-CM | POA: Diagnosis not present
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
## 2015-05-22 ENCOUNTER — Ambulatory Visit: Payer: 59 | Admitting: Physical Therapy

## 2015-05-23 ENCOUNTER — Ambulatory Visit (INDEPENDENT_AMBULATORY_CARE_PROVIDER_SITE_OTHER): Payer: Worker's Compensation | Admitting: Family Medicine

## 2015-05-23 ENCOUNTER — Ambulatory Visit: Payer: Worker's Compensation

## 2015-05-23 VITALS — BP 112/80 | HR 80 | Temp 98.7°F | Resp 16 | Ht 70.0 in | Wt 218.0 lb

## 2015-05-23 DIAGNOSIS — M546 Pain in thoracic spine: Secondary | ICD-10-CM | POA: Diagnosis not present

## 2015-05-23 DIAGNOSIS — M549 Dorsalgia, unspecified: Secondary | ICD-10-CM

## 2015-05-23 DIAGNOSIS — S161XXD Strain of muscle, fascia and tendon at neck level, subsequent encounter: Secondary | ICD-10-CM | POA: Diagnosis not present

## 2015-05-23 DIAGNOSIS — M25512 Pain in left shoulder: Secondary | ICD-10-CM | POA: Diagnosis not present

## 2015-05-23 DIAGNOSIS — T148XXA Other injury of unspecified body region, initial encounter: Secondary | ICD-10-CM

## 2015-05-23 MED ORDER — LIDOCAINE 5 % EX PTCH: 1.0000 | MEDICATED_PATCH | CUTANEOUS | Status: DC

## 2015-05-23 MED ORDER — NAPROXEN 500 MG PO TABS: 500.0000 mg | ORAL_TABLET | Freq: Two times a day (BID) | ORAL | Status: DC

## 2015-05-23 MED ORDER — METHOCARBAMOL 500 MG PO TABS: 500.0000 mg | ORAL_TABLET | Freq: Every evening | ORAL | Status: DC | PRN

## 2015-05-23 MED ORDER — METHYLPREDNISOLONE 4 MG PO TBPK: ORAL_TABLET | ORAL | Status: DC
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
## 2015-05-24 ENCOUNTER — Telehealth: Payer: Self-pay | Admitting: Family Medicine

## 2015-05-24 NOTE — Telephone Encounter (Signed)
Spoke to patietn about xray results, ned to come in for chest xray fue to banormal finding    Limited exam secondary to poor penetration on lateral film.  Convex right lumbar scoliosis.  Abnormal right paratracheal opacity. Dedicated PA and lateral chest x-ray recommended to further evaluate.  These results will be called to the ordering clinician or representative by the Radiologist Assistant, and communication documented in the PACS or zVision Dashboard.   Electronically Signed  By: Misty Stanley M.D.  On: 05/23/2015 20:31

## 2015-05-27 ENCOUNTER — Ambulatory Visit: Payer: 59 | Attending: Orthopedic Surgery | Admitting: Physical Therapy

## 2015-05-27 DIAGNOSIS — M259 Joint disorder, unspecified: Secondary | ICD-10-CM | POA: Insufficient documentation

## 2015-05-27 DIAGNOSIS — R269 Unspecified abnormalities of gait and mobility: Secondary | ICD-10-CM | POA: Insufficient documentation

## 2015-05-27 DIAGNOSIS — M25671 Stiffness of right ankle, not elsewhere classified: Secondary | ICD-10-CM | POA: Insufficient documentation

## 2015-05-28 ENCOUNTER — Ambulatory Visit: Payer: Worker's Compensation

## 2015-05-28 ENCOUNTER — Ambulatory Visit (INDEPENDENT_AMBULATORY_CARE_PROVIDER_SITE_OTHER): Payer: Worker's Compensation | Admitting: Family Medicine

## 2015-05-28 DIAGNOSIS — R9389 Abnormal findings on diagnostic imaging of other specified body structures: Secondary | ICD-10-CM

## 2015-05-28 DIAGNOSIS — R938 Abnormal findings on diagnostic imaging of other specified body structures: Secondary | ICD-10-CM | POA: Diagnosis not present
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
## 2015-05-29 ENCOUNTER — Ambulatory Visit: Payer: 59 | Admitting: Physical Therapy

## 2015-05-29 ENCOUNTER — Encounter: Payer: Self-pay | Admitting: Physical Therapy

## 2015-05-29 DIAGNOSIS — R29898 Other symptoms and signs involving the musculoskeletal system: Secondary | ICD-10-CM

## 2015-05-29 DIAGNOSIS — R269 Unspecified abnormalities of gait and mobility: Secondary | ICD-10-CM | POA: Diagnosis present

## 2015-05-29 DIAGNOSIS — M25671 Stiffness of right ankle, not elsewhere classified: Secondary | ICD-10-CM | POA: Diagnosis present

## 2015-05-29 DIAGNOSIS — M259 Joint disorder, unspecified: Secondary | ICD-10-CM | POA: Diagnosis not present
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
## 2015-06-03 ENCOUNTER — Ambulatory Visit: Payer: 59 | Admitting: Physical Therapy

## 2015-06-03 ENCOUNTER — Encounter: Payer: Self-pay | Admitting: Physical Therapy

## 2015-06-03 DIAGNOSIS — M25671 Stiffness of right ankle, not elsewhere classified: Secondary | ICD-10-CM

## 2015-06-03 DIAGNOSIS — R269 Unspecified abnormalities of gait and mobility: Secondary | ICD-10-CM

## 2015-06-03 DIAGNOSIS — M259 Joint disorder, unspecified: Secondary | ICD-10-CM | POA: Diagnosis not present

## 2015-06-03 DIAGNOSIS — R29898 Other symptoms and signs involving the musculoskeletal system: Secondary | ICD-10-CM
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
## 2015-06-04 ENCOUNTER — Ambulatory Visit: Payer: Self-pay | Admitting: Internal Medicine

## 2015-06-05 ENCOUNTER — Ambulatory Visit: Payer: 59 | Admitting: Physical Therapy

## 2015-06-05 ENCOUNTER — Encounter: Payer: Self-pay | Admitting: Physical Therapy

## 2015-06-05 DIAGNOSIS — R269 Unspecified abnormalities of gait and mobility: Secondary | ICD-10-CM

## 2015-06-05 DIAGNOSIS — M25671 Stiffness of right ankle, not elsewhere classified: Secondary | ICD-10-CM

## 2015-06-05 DIAGNOSIS — M259 Joint disorder, unspecified: Secondary | ICD-10-CM | POA: Diagnosis not present

## 2015-06-05 DIAGNOSIS — R29898 Other symptoms and signs involving the musculoskeletal system: Secondary | ICD-10-CM
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
## 2015-06-10 ENCOUNTER — Ambulatory Visit: Payer: 59 | Admitting: Physical Therapy

## 2015-06-10 DIAGNOSIS — R29898 Other symptoms and signs involving the musculoskeletal system: Secondary | ICD-10-CM

## 2015-06-10 DIAGNOSIS — M25671 Stiffness of right ankle, not elsewhere classified: Secondary | ICD-10-CM

## 2015-06-10 DIAGNOSIS — R269 Unspecified abnormalities of gait and mobility: Secondary | ICD-10-CM

## 2015-06-10 DIAGNOSIS — M259 Joint disorder, unspecified: Secondary | ICD-10-CM | POA: Diagnosis not present
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
## 2015-06-12 ENCOUNTER — Encounter: Payer: 59 | Admitting: Physical Therapy

## 2015-06-17 ENCOUNTER — Ambulatory Visit: Payer: 59 | Admitting: Physical Therapy

## 2015-06-17 ENCOUNTER — Encounter: Payer: Self-pay | Admitting: Physical Therapy

## 2015-06-17 DIAGNOSIS — M259 Joint disorder, unspecified: Secondary | ICD-10-CM | POA: Diagnosis not present

## 2015-06-17 DIAGNOSIS — R29898 Other symptoms and signs involving the musculoskeletal system: Secondary | ICD-10-CM

## 2015-06-17 DIAGNOSIS — R269 Unspecified abnormalities of gait and mobility: Secondary | ICD-10-CM

## 2015-06-17 DIAGNOSIS — M25671 Stiffness of right ankle, not elsewhere classified: Secondary | ICD-10-CM
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
## 2015-06-19 ENCOUNTER — Ambulatory Visit: Payer: 59 | Admitting: Physical Therapy

## 2015-06-19 DIAGNOSIS — R29898 Other symptoms and signs involving the musculoskeletal system: Secondary | ICD-10-CM

## 2015-06-19 DIAGNOSIS — M259 Joint disorder, unspecified: Secondary | ICD-10-CM | POA: Diagnosis not present

## 2015-06-19 DIAGNOSIS — M25671 Stiffness of right ankle, not elsewhere classified: Secondary | ICD-10-CM

## 2015-06-19 DIAGNOSIS — R269 Unspecified abnormalities of gait and mobility: Secondary | ICD-10-CM
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
## 2015-06-20 ENCOUNTER — Telehealth: Payer: Self-pay

## 2015-06-20 DIAGNOSIS — M25512 Pain in left shoulder: Secondary | ICD-10-CM

## 2015-06-20 DIAGNOSIS — M542 Cervicalgia: Secondary | ICD-10-CM

## 2015-06-20 NOTE — Telephone Encounter (Signed)
Done

## 2015-06-20 NOTE — Telephone Encounter (Signed)
Dr, Marin Comment    Requesting a referral to ortho for neck and shoulder.   [phone]

## 2015-06-24 ENCOUNTER — Encounter: Payer: 59 | Admitting: Physical Therapy

## 2015-06-26 ENCOUNTER — Ambulatory Visit: Payer: 59 | Admitting: Physical Therapy

## 2015-07-01 ENCOUNTER — Ambulatory Visit: Payer: 59 | Attending: Orthopedic Surgery | Admitting: Physical Therapy

## 2015-07-01 ENCOUNTER — Encounter: Payer: Self-pay | Admitting: Physical Therapy

## 2015-07-01 DIAGNOSIS — M259 Joint disorder, unspecified: Secondary | ICD-10-CM | POA: Insufficient documentation

## 2015-07-01 DIAGNOSIS — R609 Edema, unspecified: Secondary | ICD-10-CM | POA: Insufficient documentation

## 2015-07-01 DIAGNOSIS — R29898 Other symptoms and signs involving the musculoskeletal system: Secondary | ICD-10-CM

## 2015-07-01 DIAGNOSIS — M25671 Stiffness of right ankle, not elsewhere classified: Secondary | ICD-10-CM | POA: Insufficient documentation

## 2015-07-01 DIAGNOSIS — R269 Unspecified abnormalities of gait and mobility: Secondary | ICD-10-CM
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
## 2015-07-03 ENCOUNTER — Ambulatory Visit: Payer: 59

## 2015-07-03 DIAGNOSIS — R29898 Other symptoms and signs involving the musculoskeletal system: Secondary | ICD-10-CM

## 2015-07-03 DIAGNOSIS — R269 Unspecified abnormalities of gait and mobility: Secondary | ICD-10-CM

## 2015-07-03 DIAGNOSIS — M259 Joint disorder, unspecified: Secondary | ICD-10-CM | POA: Diagnosis not present

## 2015-07-03 DIAGNOSIS — M25671 Stiffness of right ankle, not elsewhere classified: Secondary | ICD-10-CM
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
## 2015-07-08 ENCOUNTER — Ambulatory Visit: Payer: 59

## 2015-07-08 DIAGNOSIS — M25473 Effusion, unspecified ankle: Secondary | ICD-10-CM

## 2015-07-08 DIAGNOSIS — R269 Unspecified abnormalities of gait and mobility: Secondary | ICD-10-CM

## 2015-07-08 DIAGNOSIS — R29898 Other symptoms and signs involving the musculoskeletal system: Secondary | ICD-10-CM

## 2015-07-08 DIAGNOSIS — M25671 Stiffness of right ankle, not elsewhere classified: Secondary | ICD-10-CM

## 2015-07-08 DIAGNOSIS — M259 Joint disorder, unspecified: Secondary | ICD-10-CM | POA: Diagnosis not present
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
## 2016-03-29 ENCOUNTER — Encounter (INDEPENDENT_AMBULATORY_CARE_PROVIDER_SITE_OTHER): Payer: Self-pay | Admitting: Orthopedic Surgery

## 2016-03-29 ENCOUNTER — Ambulatory Visit (INDEPENDENT_AMBULATORY_CARE_PROVIDER_SITE_OTHER): Payer: 59 | Admitting: Orthopedic Surgery

## 2016-03-29 ENCOUNTER — Ambulatory Visit (INDEPENDENT_AMBULATORY_CARE_PROVIDER_SITE_OTHER): Payer: 59

## 2016-03-29 VITALS — Ht 70.0 in | Wt 205.0 lb

## 2016-03-29 DIAGNOSIS — M25571 Pain in right ankle and joints of right foot: Secondary | ICD-10-CM | POA: Diagnosis not present

## 2016-03-29 DIAGNOSIS — M79671 Pain in right foot: Secondary | ICD-10-CM

## 2016-03-29 MED ORDER — METHYLPREDNISOLONE ACETATE 40 MG/ML IJ SUSP
40.0000 mg | INTRAMUSCULAR | Status: AC | PRN
  Administered 2016-03-29: 40 mg via INTRA_ARTICULAR

## 2016-03-29 MED ORDER — LIDOCAINE HCL 1 % IJ SOLN
2.0000 mL | INTRAMUSCULAR | Status: AC | PRN
  Administered 2016-03-29: 2 mL
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
## 2016-03-30 ENCOUNTER — Encounter (INDEPENDENT_AMBULATORY_CARE_PROVIDER_SITE_OTHER): Payer: Self-pay | Admitting: Radiology

## 2016-04-19 ENCOUNTER — Ambulatory Visit (INDEPENDENT_AMBULATORY_CARE_PROVIDER_SITE_OTHER): Payer: Self-pay | Admitting: Orthopedic Surgery

## 2016-04-27 ENCOUNTER — Telehealth (INDEPENDENT_AMBULATORY_CARE_PROVIDER_SITE_OTHER): Payer: Self-pay | Admitting: Orthopedic Surgery

## 2016-05-06 ENCOUNTER — Ambulatory Visit (INDEPENDENT_AMBULATORY_CARE_PROVIDER_SITE_OTHER): Payer: Self-pay | Admitting: Orthopedic Surgery

## 2016-05-11 NOTE — Telephone Encounter (Signed)
Called pt to advise that his dics is ready for pick up.

## 2016-06-01 DIAGNOSIS — T84418A Breakdown (mechanical) of other internal orthopedic devices, implants and grafts, initial encounter: Secondary | ICD-10-CM | POA: Diagnosis not present

## 2016-06-04 DIAGNOSIS — S92252K Displaced fracture of navicular [scaphoid] of left foot, subsequent encounter for fracture with nonunion: Secondary | ICD-10-CM | POA: Diagnosis not present

## 2016-09-16 DIAGNOSIS — S92252K Displaced fracture of navicular [scaphoid] of left foot, subsequent encounter for fracture with nonunion: Secondary | ICD-10-CM | POA: Diagnosis not present

## 2016-09-16 DIAGNOSIS — M19071 Primary osteoarthritis, right ankle and foot: Secondary | ICD-10-CM | POA: Insufficient documentation

## 2016-09-16 DIAGNOSIS — K219 Gastro-esophageal reflux disease without esophagitis: Secondary | ICD-10-CM | POA: Diagnosis not present

## 2016-09-16 DIAGNOSIS — Z87891 Personal history of nicotine dependence: Secondary | ICD-10-CM | POA: Diagnosis not present

## 2016-09-16 DIAGNOSIS — Z4789 Encounter for other orthopedic aftercare: Secondary | ICD-10-CM | POA: Diagnosis not present

## 2016-09-16 DIAGNOSIS — S92251K Displaced fracture of navicular [scaphoid] of right foot, subsequent encounter for fracture with nonunion: Secondary | ICD-10-CM | POA: Diagnosis not present

## 2016-09-17 DIAGNOSIS — M19071 Primary osteoarthritis, right ankle and foot: Secondary | ICD-10-CM | POA: Diagnosis not present

## 2016-09-17 DIAGNOSIS — M25571 Pain in right ankle and joints of right foot: Secondary | ICD-10-CM | POA: Diagnosis not present

## 2016-09-24 DIAGNOSIS — M79671 Pain in right foot: Secondary | ICD-10-CM | POA: Diagnosis not present

## 2016-10-08 DIAGNOSIS — M79671 Pain in right foot: Secondary | ICD-10-CM | POA: Diagnosis not present

## 2016-10-28 DIAGNOSIS — M79671 Pain in right foot: Secondary | ICD-10-CM | POA: Diagnosis not present

## 2016-11-04 DIAGNOSIS — M79671 Pain in right foot: Secondary | ICD-10-CM | POA: Diagnosis not present

## 2016-11-04 DIAGNOSIS — R6889 Other general symptoms and signs: Secondary | ICD-10-CM | POA: Diagnosis not present

## 2016-11-19 DIAGNOSIS — R6889 Other general symptoms and signs: Secondary | ICD-10-CM | POA: Diagnosis not present

## 2016-11-19 DIAGNOSIS — M79671 Pain in right foot: Secondary | ICD-10-CM | POA: Diagnosis not present

## 2016-11-25 DIAGNOSIS — M79671 Pain in right foot: Secondary | ICD-10-CM | POA: Diagnosis not present

## 2016-11-25 DIAGNOSIS — R6889 Other general symptoms and signs: Secondary | ICD-10-CM | POA: Diagnosis not present

## 2016-12-02 DIAGNOSIS — R6889 Other general symptoms and signs: Secondary | ICD-10-CM | POA: Diagnosis not present

## 2016-12-02 DIAGNOSIS — M79671 Pain in right foot: Secondary | ICD-10-CM | POA: Diagnosis not present

## 2016-12-13 DIAGNOSIS — R6889 Other general symptoms and signs: Secondary | ICD-10-CM | POA: Diagnosis not present

## 2016-12-13 DIAGNOSIS — M79671 Pain in right foot: Secondary | ICD-10-CM | POA: Diagnosis not present

## 2016-12-21 DIAGNOSIS — R6889 Other general symptoms and signs: Secondary | ICD-10-CM | POA: Diagnosis not present

## 2016-12-21 DIAGNOSIS — M79671 Pain in right foot: Secondary | ICD-10-CM | POA: Diagnosis not present

## 2016-12-31 DIAGNOSIS — M79671 Pain in right foot: Secondary | ICD-10-CM | POA: Diagnosis not present

## 2016-12-31 DIAGNOSIS — R6889 Other general symptoms and signs: Secondary | ICD-10-CM | POA: Diagnosis not present

## 2017-01-07 DIAGNOSIS — M79671 Pain in right foot: Secondary | ICD-10-CM | POA: Diagnosis not present
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
## 2017-03-11 DIAGNOSIS — M79671 Pain in right foot: Secondary | ICD-10-CM | POA: Diagnosis not present

## 2017-03-31 ENCOUNTER — Encounter: Payer: Self-pay | Admitting: Family Medicine

## 2017-04-04 DIAGNOSIS — Z23 Encounter for immunization: Secondary | ICD-10-CM | POA: Diagnosis not present

## 2017-04-04 DIAGNOSIS — Z0000 Encounter for general adult medical examination without abnormal findings: Secondary | ICD-10-CM | POA: Diagnosis not present

## 2017-04-07 DIAGNOSIS — L739 Follicular disorder, unspecified: Secondary | ICD-10-CM | POA: Diagnosis not present

## 2017-04-07 DIAGNOSIS — D485 Neoplasm of uncertain behavior of skin: Secondary | ICD-10-CM | POA: Diagnosis not present

## 2017-04-07 DIAGNOSIS — L942 Calcinosis cutis: Secondary | ICD-10-CM | POA: Diagnosis not present

## 2017-04-07 DIAGNOSIS — L720 Epidermal cyst: Secondary | ICD-10-CM | POA: Diagnosis not present

## 2017-04-08 DIAGNOSIS — H16223 Keratoconjunctivitis sicca, not specified as Sjogren's, bilateral: Secondary | ICD-10-CM | POA: Diagnosis not present

## 2017-04-10 LAB — TSH: TSH: 4.76 (ref <=5.90)

## 2017-04-13 DIAGNOSIS — R7989 Other specified abnormal findings of blood chemistry: Secondary | ICD-10-CM | POA: Diagnosis not present

## 2017-04-13 LAB — LIPID PANEL
Cholesterol: 233 — AB (ref 0–200)
HDL: 50 (ref 35–70)
LDL CALC: 133
Triglycerides: 97 (ref 40–160)

## 2017-04-22 DIAGNOSIS — M2241 Chondromalacia patellae, right knee: Secondary | ICD-10-CM | POA: Diagnosis not present

## 2017-04-22 DIAGNOSIS — M25561 Pain in right knee: Secondary | ICD-10-CM | POA: Diagnosis not present

## 2017-05-02 ENCOUNTER — Ambulatory Visit: Payer: 59 | Admitting: Family Medicine
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
## 2017-10-07 ENCOUNTER — Encounter: Payer: Self-pay | Admitting: Emergency Medicine

## 2017-10-07 ENCOUNTER — Ambulatory Visit (INDEPENDENT_AMBULATORY_CARE_PROVIDER_SITE_OTHER): Payer: 59 | Admitting: Family Medicine

## 2017-10-07 ENCOUNTER — Encounter: Payer: Self-pay | Admitting: Family Medicine

## 2017-10-07 ENCOUNTER — Other Ambulatory Visit: Payer: Self-pay

## 2017-10-07 VITALS — BP 138/82 | HR 62 | Temp 98.3°F | Ht 70.0 in | Wt 217.6 lb

## 2017-10-07 DIAGNOSIS — Z8349 Family history of other endocrine, nutritional and metabolic diseases: Secondary | ICD-10-CM | POA: Insufficient documentation

## 2017-10-07 DIAGNOSIS — F988 Other specified behavioral and emotional disorders with onset usually occurring in childhood and adolescence: Secondary | ICD-10-CM | POA: Diagnosis not present

## 2017-10-07 DIAGNOSIS — L739 Follicular disorder, unspecified: Secondary | ICD-10-CM | POA: Diagnosis not present

## 2017-10-07 DIAGNOSIS — E039 Hypothyroidism, unspecified: Secondary | ICD-10-CM | POA: Diagnosis not present

## 2017-10-07 DIAGNOSIS — E038 Other specified hypothyroidism: Secondary | ICD-10-CM

## 2017-10-07 DIAGNOSIS — G44229 Chronic tension-type headache, not intractable: Secondary | ICD-10-CM | POA: Diagnosis not present

## 2017-10-07 HISTORY — DX: Other specified hypothyroidism: E03.8

## 2017-10-07 LAB — T3, FREE: T3 FREE: 3.5

## 2017-10-07 LAB — T4, FREE: T4,FREE (DIRECT): 6.3

## 2017-10-07 MED ORDER — LISDEXAMFETAMINE DIMESYLATE 20 MG PO CAPS: 20.0000 mg | ORAL_CAPSULE | Freq: Every day | ORAL | 0 refills | Status: DC

## 2017-10-07 MED ORDER — DESONIDE 0.05 % EX LOTN: TOPICAL_LOTION | Freq: Two times a day (BID) | CUTANEOUS | 1 refills | Status: DC

## 2017-10-07 MED ORDER — CLINDAMYCIN PHOSPHATE 1 % EX GEL: Freq: Two times a day (BID) | CUTANEOUS | 2 refills | Status: DC
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
## 2017-10-07 NOTE — Patient Instructions (Addendum)
Please return in 4-6 weeks to recheck ADD medication and scalp.  [facility]  Please check on [email or web address] for coupon cards for refills.    It was a pleasure meeting you today! Thank you for choosing [place] to meet your healthcare needs! I truly look forward to working with you. If you have any questions or concerns, please send me a message via Mychart or call the office at [phone].   Tension Headache A tension headache is a feeling of pain, pressure, or aching that is often felt over the front and sides of the head. The pain can be dull, or it can feel tight (constricting). Tension headaches are not normally associated with nausea or vomiting, and they do not get worse with physical activity. Tension headaches can last from 30 minutes to several days. This is the most common type of headache. CAUSES The exact cause of this condition is not known. Tension headaches often begin after stress, anxiety, or depression. Other triggers may include:  Alcohol.  Too much caffeine, or caffeine withdrawal.  Respiratory infections, such as colds, flu, or sinus infections.  Dental problems or teeth clenching.  Fatigue.  Holding your head and neck in the same position for a long period of time, such as while using a computer.  Smoking. SYMPTOMS Symptoms of this condition include:  A feeling of pressure around the head.  Dull, aching head pain.  Pain felt over the front and sides of the head.  Tenderness in the muscles of the head, neck, and shoulders. DIAGNOSIS This condition may be diagnosed based on your symptoms and a physical exam. Tests may be done, such as a CT scan or an MRI of your head. These tests may be done if your symptoms are severe or unusual. TREATMENT This condition may be treated with lifestyle changes and medicines to help relieve symptoms. HOME CARE INSTRUCTIONS Managing Pain  Take over-the-counter and prescription medicines only  as told by your health care provider.  Lie down in a dark, quiet room when you have a headache.  If directed, apply ice to the head and neck area: ? Put ice in a plastic bag. ? Place a towel between your skin and the bag. ? Leave the ice on for 20 minutes, 2-3 times per day.  Use a heating pad or a hot shower to apply heat to the head and neck area as told by your health care provider. Eating and Drinking  Eat meals on a regular schedule.  Limit alcohol use.  Decrease your caffeine intake, or stop using caffeine. General Instructions  Keep all follow-up visits as told by your health care provider. This is important.  Keep a headache journal to help find out what may trigger your headaches. For example, write down: ? What you eat and drink. ? How much sleep you get. ? Any change to your diet or medicines.  Try massage or other relaxation techniques.  Limit stress.  Sit up straight, and avoid tensing your muscles.  Do not use tobacco products, including cigarettes, chewing tobacco, or e-cigarettes. If you need help quitting, ask your health care provider.  Exercise regularly as told by your health care provider.  Get 7-9 hours of sleep, or the amount recommended by your health care provider. SEEK MEDICAL CARE IF:  Your symptoms are not helped by medicine.  You have a headache that is different from what you normally experience.  You have nausea or you vomit.  You have a fever. SEEK IMMEDIATE MEDICAL CARE IF:  Your headache becomes severe.  You have repeated vomiting.  You have a stiff neck.  You have a loss of vision.  You have problems with speech.  You have pain in your eye or ear.  You have muscular weakness or loss of muscle control.  You lose your balance or you have trouble walking.  You feel faint or you pass out.  You have confusion. This information is not intended to replace advice given to you by your health care provider. Make sure you  discuss any questions you have with your health care provider. Document Released: 05/10/2005 Document Revised: 01/29/2015 Document Reviewed: 09/02/2014 Elsevier Interactive Patient Education  2017 [employer].

## 2017-11-03 ENCOUNTER — Encounter: Payer: Self-pay | Admitting: Family Medicine

## 2017-11-04 ENCOUNTER — Ambulatory Visit: Payer: 59 | Admitting: Family Medicine

## 2017-11-07 ENCOUNTER — Ambulatory Visit: Payer: 59 | Admitting: Family Medicine

## 2017-11-08 ENCOUNTER — Encounter: Payer: Self-pay | Admitting: Family Medicine

## 2017-11-08 ENCOUNTER — Other Ambulatory Visit: Payer: Self-pay

## 2017-11-08 ENCOUNTER — Ambulatory Visit: Payer: 59 | Admitting: Family Medicine

## 2017-11-08 VITALS — BP 124/86 | HR 67 | Temp 98.3°F | Ht 70.0 in | Wt 215.0 lb

## 2017-11-08 DIAGNOSIS — F988 Other specified behavioral and emotional disorders with onset usually occurring in childhood and adolescence: Secondary | ICD-10-CM

## 2017-11-08 DIAGNOSIS — N529 Male erectile dysfunction, unspecified: Secondary | ICD-10-CM

## 2017-11-08 DIAGNOSIS — L739 Follicular disorder, unspecified: Secondary | ICD-10-CM | POA: Diagnosis not present

## 2017-11-08 MED ORDER — LISDEXAMFETAMINE DIMESYLATE 40 MG PO CAPS
40.0000 mg | ORAL_CAPSULE | Freq: Every day | ORAL | 0 refills | Status: DC
Start: 2018-01-07 — End: 2018-02-08

## 2017-11-08 MED ORDER — LISDEXAMFETAMINE DIMESYLATE 40 MG PO CAPS: 40.0000 mg | ORAL_CAPSULE | Freq: Every day | ORAL | 0 refills | Status: DC

## 2017-11-08 MED ORDER — DOXYCYCLINE HYCLATE 100 MG PO TABS: 100.0000 mg | ORAL_TABLET | Freq: Two times a day (BID) | ORAL | 0 refills | Status: AC

## 2017-11-08 MED ORDER — SILDENAFIL CITRATE 100 MG PO TABS: 100.0000 mg | ORAL_TABLET | Freq: Every day | ORAL | 5 refills | Status: DC | PRN

## 2017-11-08 MED ORDER — SILDENAFIL CITRATE 50 MG PO TABS: 50.0000 mg | ORAL_TABLET | Freq: Every day | ORAL | 5 refills | Status: DC | PRN

## 2017-11-08 NOTE — Progress Notes (Signed)
Subjective  CC:  Chief Complaint  Patient presents with  . ADHD    doing well, Vyvanse is working well  . 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 is a 47 y.o. male who presents to the office today to address the problems listed above in the chief complaint.  Patient is here today for follow up of ADD/ADHD. He is taking medication as directed and continues to feel it is beneficial.  He started with Vyvanse 20 mg daily.  He noted that it was not quite strong enough and doubled his dose to 40 mg now Effexor excellent.  Lasting long enough.  The medications continue to help with focus and attention and task completion. He denies adverse side effects; specifically no headaches, appetite suppression, weight loss, sleeping difficulty, heart palpitations, chest pain or significant weight changes.  This patient does not have contraindications for stimulant use include hypertension, tachycardia, arrhythmia, psychosis, bipolar disorder, severe anorexia, and Tourette syndrome.   Chronic folliculitis: Persists in the back of the scalp.  He has been using the clindamycin gel with some improvement in symptoms.  Use desonide solution but did not help at all.  Has been to a dermatologist but did not get many answers on etiology, preventive measures, prognosis.  Has been a while since has been on oral antibiotics.  No pain.  ED: Able to obtain erections but having difficulty sustaining them at times.  Has been on Viagra in the past and it works well.  No side effects.  Requests refill.  Happy healthy monogamous relationship.   Assessment  1. Attention deficit disorder (ADD) in adult   2. Chronic folliculitis   3. Vasculogenic erectile dysfunction, unspecified vasculogenic erectile dysfunction type      Plan   ADD: Improved on Vyvanse 40 mg refilled for the next 3 months.  Controlled substance contract signed.  Urine drug screen  obtained today.  Recheck in 3 months.  Chronic folliculitis of the scalp: Doxycycline for 1 week.  Continue Clinda as needed.  Refer to dermatology for second evaluation as patient has many questions.   ED: Viagra prescribed.  Counseled on appropriate use and expectations.  Follow up: No follow-ups on file.  Orders Placed This Encounter  Procedures  . Pain Mgmt, Profile 8 w/Conf, U  . Ambulatory referral to Dermatology   Meds ordered this encounter  Medications  . DISCONTD: lisdexamfetamine (VYVANSE) 40 MG capsule    Sig: Take 1 capsule (40 mg total) by mouth daily.    Dispense:  30 capsule    Refill:  0    Patient has coupon to be used with RX.  Marland Kitchen DISCONTD: lisdexamfetamine (VYVANSE) 40 MG capsule    Sig: Take 1 capsule (40 mg total) by mouth daily.    Dispense:  30 capsule    Refill:  0    Patient has coupon to be used with RX.  Marland Kitchen lisdexamfetamine (VYVANSE) 40 MG capsule    Sig: Take 1 capsule (40 mg total) by mouth daily.    Dispense:  30 capsule    Refill:  0    Patient has coupon to be used with RX.  Marland Kitchen DISCONTD: sildenafil (VIAGRA) 50 MG tablet    Sig: Take 1-2 tablets (50-100 mg total) by mouth daily as needed for erectile dysfunction.    Dispense:  10 tablet    Refill:  5  . doxycycline (VIBRA-TABS) 100  MG tablet    Sig: Take 1 tablet (100 mg total) by mouth 2 (two) times daily for 7 days.    Dispense:  14 tablet    Refill:  0  . sildenafil (VIAGRA) 100 MG tablet    Sig: Take 1 tablet (100 mg total) by mouth daily as needed for erectile dysfunction.    Dispense:  10 tablet    Refill:  5      I reviewed the patients updated PMH, FH, and SocHx.    Patient Active Problem List   Diagnosis Date Noted  . Erectile dysfunction 11/08/2017  . Subclinical hypothyroidism 10/07/2017  . Chronic folliculitis 37/90/2409  . Chronic tension-type headache, not intractable 10/07/2017  . Attention deficit disorder (ADD) in adult 05/13/2014   Current Meds  Medication Sig  .  Acetaminophen-Caffeine (EXCEDRIN TENSION HEADACHE PO) Take 1 tablet by mouth daily as needed (headaches).   . clindamycin (CLINDAGEL) 1 % gel Apply topically 2 (two) times daily.  Marland Kitchen desonide (DESOWEN) 0.05 % lotion Apply topically 2 (two) times daily. Use for 1-2 weeks, then as needed.  Derrill Memo ON 01/07/2018] lisdexamfetamine (VYVANSE) 40 MG capsule Take 1 capsule (40 mg total) by mouth daily.  . [DISCONTINUED] lisdexamfetamine (VYVANSE) 20 MG capsule Take 1 capsule (20 mg total) by mouth daily.  . [DISCONTINUED] lisdexamfetamine (VYVANSE) 40 MG capsule Take 1 capsule (40 mg total) by mouth daily.  . [DISCONTINUED] lisdexamfetamine (VYVANSE) 40 MG capsule Take 1 capsule (40 mg total) by mouth daily.    Allergies: Patient is allergic to aspirin. Family History: Patient family history includes Hyperlipidemia in his father and mother; Thyroid disease in his mother and sister. Social History:  Patient  reports that he has never smoked. He has never used smokeless tobacco. He reports that he drinks alcohol. He reports that he does not use drugs.  Review of Systems: Constitutional: Negative for fever malaise or anorexia Cardiovascular: negative for chest pain Respiratory: negative for SOB or persistent cough Gastrointestinal: negative for abdominal pain  Objective  Vitals: BP 124/86   Pulse 67   Temp 98.3 F (36.8 C)   Ht 5\' 10"  (1.778 m)   Wt 215 lb (97.5 kg)   SpO2 98%   BMI 30.85 kg/m  General: no acute distress , A&Ox3  Skin:  Warm, lower posterior scalp with pustules.    Commons side effects, risks, benefits, and alternatives for medications and treatment plan prescribed today were discussed, and the patient expressed understanding of the given instructions. Patient is instructed to call or message via MyChart if he/she has any questions or concerns regarding our treatment plan. No barriers to understanding were identified. We discussed Red Flag symptoms and signs in detail.  Patient expressed understanding regarding what to do in case of urgent or emergency type symptoms.   Medication list was reconciled, printed and provided to the patient in AVS. Patient instructions and summary information was reviewed with the patient as documented in the AVS. This note was prepared with assistance of Dragon voice recognition software. Occasional wrong-word or sound-a-like substitutions may have occurred due to the inherent limitations of voice recognition software

## 2017-11-08 NOTE — Patient Instructions (Addendum)
Please return in 3 months for reevaluation and refills of Vyvanse.   Take the RX and the activated coupon to the pharmacy.   If you have any questions or concerns, please don't hesitate to send me a message via MyChart or call the office at [phone]. Thank you for visiting with [place] today! It's our pleasure caring for you.  We will call you with information regarding your referral appointment. Dermatology: Karma Lew or Dr. Denna Haggard.  If you do not hear from [place] within the next 2 weeks, please let me know. It can take 1-2 weeks to get appointments set up with the specialists.

## 2017-11-10 LAB — PAIN MGMT, PROFILE 8 W/CONF, U
6 ACETYLMORPHINE: NEGATIVE ng/mL (ref <10)
ALCOHOL METABOLITES: NEGATIVE ng/mL (ref <500)
Amphetamine: 1000 ng/mL — ABNORMAL HIGH (ref <250)
Amphetamines: POSITIVE ng/mL — AB (ref <500)
BENZODIAZEPINES: NEGATIVE ng/mL (ref <100)
Buprenorphine, Urine: NEGATIVE ng/mL (ref <5)
COCAINE METABOLITE: NEGATIVE ng/mL (ref <150)
CREATININE: 202.9 mg/dL
MDMA: NEGATIVE ng/mL (ref <500)
Marijuana Metabolite: NEGATIVE ng/mL (ref <20)
Methamphetamine: NEGATIVE ng/mL (ref <250)
OPIATES: NEGATIVE ng/mL (ref <100)
OXIDANT: NEGATIVE ug/mL (ref <200)
OXYCODONE: NEGATIVE ng/mL (ref <100)
PH: 6.41 (ref 4.5–9.0)

## 2017-11-11 ENCOUNTER — Telehealth: Payer: Self-pay | Admitting: Family Medicine

## 2017-11-11 NOTE — Telephone Encounter (Signed)
Copied from [place] [id number]. Topic: Quick Communication - See Telephone Encounter >> Nov 11, 2017  4:30 PM Rutherford Nail, [place] wrote: CRM for notification. See Telephone encounter for: 11/11/17. Patient calling and states that a prior authorization is needed for the lisdexamfetamine (VYVANSE) 40 MG capsule. CB#: [phone]

## 2017-11-15 NOTE — Telephone Encounter (Signed)
Started PA though Cover My Meds, case number started with JKK but my computer locked up and was not able to get the rest of the number.

## 2017-11-22 ENCOUNTER — Encounter (INDEPENDENT_AMBULATORY_CARE_PROVIDER_SITE_OTHER): Payer: Self-pay

## 2017-12-27 ENCOUNTER — Other Ambulatory Visit: Payer: Self-pay | Admitting: Family Medicine

## 2017-12-28 NOTE — Telephone Encounter (Signed)
Last OV 11/08/17, Next OV 02/07/18  Last filled 11/08/17, # 10 with 5 refills  Rx request had 0 refills but I changed to 5 that was on the original Rx.

## 2018-01-25 ENCOUNTER — Encounter: Payer: Self-pay | Admitting: Family Medicine

## 2018-01-25 DIAGNOSIS — L089 Local infection of the skin and subcutaneous tissue, unspecified: Secondary | ICD-10-CM | POA: Diagnosis not present

## 2018-01-25 DIAGNOSIS — L723 Sebaceous cyst: Secondary | ICD-10-CM | POA: Diagnosis not present

## 2018-01-25 DIAGNOSIS — D2360 Other benign neoplasm of skin of unspecified upper limb, including shoulder: Secondary | ICD-10-CM | POA: Diagnosis not present

## 2018-01-25 DIAGNOSIS — L739 Follicular disorder, unspecified: Secondary | ICD-10-CM | POA: Diagnosis not present

## 2018-01-31 ENCOUNTER — Other Ambulatory Visit: Payer: Self-pay | Admitting: Family Medicine

## 2018-01-31 NOTE — Telephone Encounter (Signed)
Vyvanse 40 mg. refill Last Refill: 01/07/18 # 30; no refills Last OV: 11/08/17 PCP: Dr. Jonni Sanger [facility] [address]   Not that pt. is requesting a change to 30 mg. dose.

## 2018-01-31 NOTE — Telephone Encounter (Signed)
Please call patient.  He has a f/u appt with me on the 17th.  We will refill his medication at that time and discuss dosage changes as well.   He was given vyvanse 6/18 with 3 refills. Should have medication till appt.

## 2018-01-31 NOTE — Telephone Encounter (Signed)
Copied from [place]. Topic: Quick Communication - See Telephone Encounter >> Jan 31, 2018  2:56 PM Mylinda Latina, [place] wrote: CRM for notification. See Telephone encounter for: 01/31/18. Patient called and states he needs a rx sent to his pharmacy. Patient was taking lisdexamfetamine (VYVANSE) 40 MG capsule and he would like to change the dose to 30mg . Please advise if this can be changed. CB# [phone]  [id number] [address], [place] [phone] (Phone) [phone] (Fax)

## 2018-02-01 NOTE — Telephone Encounter (Signed)
Spoke with Jeremy Atkins, he states he has more than enough refills to last him, because he does not take it everyday. He wanted to get the Dose changed for the existing refills. Jeremy Atkins is aware of Appointment on 09/17 and he can discuss with Dr. Jonni Sanger at this visit.  Jeremy Atkins verbalized understanding.   Doloris Hall,  LPN

## 2018-02-07 ENCOUNTER — Ambulatory Visit: Payer: 59 | Admitting: Family Medicine

## 2018-02-08 ENCOUNTER — Other Ambulatory Visit: Payer: Self-pay

## 2018-02-08 ENCOUNTER — Encounter: Payer: Self-pay | Admitting: Family Medicine

## 2018-02-08 ENCOUNTER — Ambulatory Visit: Payer: 59 | Admitting: Family Medicine

## 2018-02-08 VITALS — BP 116/82 | HR 64 | Temp 98.4°F | Ht 70.0 in | Wt 210.0 lb

## 2018-02-08 DIAGNOSIS — L739 Follicular disorder, unspecified: Secondary | ICD-10-CM | POA: Diagnosis not present

## 2018-02-08 DIAGNOSIS — F988 Other specified behavioral and emotional disorders with onset usually occurring in childhood and adolescence: Secondary | ICD-10-CM | POA: Diagnosis not present

## 2018-02-08 MED ORDER — LISDEXAMFETAMINE DIMESYLATE 30 MG PO CAPS: 30.0000 mg | ORAL_CAPSULE | Freq: Every day | ORAL | 0 refills | Status: DC

## 2018-02-08 NOTE — Patient Instructions (Addendum)
Please return in 3 months for your annual complete physical; please come fasting.  I have sent in the 30mg  capsule. Let me know in a month or so if I need to refill this dose or the 40mg  or the 20mg  as we discussed.    If you have any questions or concerns, please don't hesitate to send me a message via MyChart or call the office at [phone]. Thank you for visiting with [place] today! It's our pleasure caring for you.

## 2018-02-08 NOTE — Progress Notes (Signed)
Subjective  CC:  Chief Complaint  Patient presents with  . ADHD    wants to discuss decreasing his dosage from 40mg  to 30mg      HPI: Jeremy Atkins. is a 47 y.o. male who presents to the office today to address the problems listed above in the chief complaint.  Patient is here today for follow up of ADD/ADHD. He is taking medication as directed and continues to feel it is beneficial. The medications continue to help with focus and attention and task completion.  However, he notes that he has excessive thirst and occasional headache the next morning after taking the 40 mg dose.  From memory, the 20 mg dose was not strong enough.  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  He feels less of a need when he is out in the field.  This patient does not have contraindications for stimulant use include hypertension, tachycardia, arrhythmia, psychosis, bipolar disorder, severe anorexia, and Tourette syndrome.   Folliculitis follow-up: He did see dermatology who is helping him.  New medications have controlled his inflammatory response.  He defers his flu vaccination for now. Assessment  1. Attention deficit disorder (ADD) in adult   2. Chronic folliculitis      Plan   ADD: Long discussion regarding proper medication and dose.  Will try 30 mg Vyvanse daily.  Educated on risk versus benefits.  If side effects persist, recommend changing medication.  Patient will let me know in 1 month what dose prefers.  Folliculitis much improved on oral antibiotics and topical strong steroids. Has f/u with derm.   He will call for nurse visit for flu shot.  Follow up: Complete physical in 3 months with ADD follow-up No orders of the defined types were placed in this encounter.  No orders of the defined types were placed in this encounter.     I reviewed the patients updated PMH, FH, and SocHx.    Patient  Active Problem List   Diagnosis Date Noted  . Erectile dysfunction 11/08/2017  . Subclinical hypothyroidism 10/07/2017  . Chronic folliculitis 19/14/7829  . Chronic tension-type headache, not intractable 10/07/2017  . Attention deficit disorder (ADD) in adult 05/13/2014   Current Meds  Medication Sig  . Acetaminophen-Caffeine (EXCEDRIN TENSION HEADACHE PO) Take 1 tablet by mouth daily as needed (headaches).   . lisdexamfetamine (VYVANSE) 40 MG capsule Take 1 capsule (40 mg total) by mouth daily.  . sildenafil (VIAGRA) 100 MG tablet TAKE ONE TABLET BY MOUTH DAILY AS NEEDED FOR ERECTILE DYSFUNCTION  . sulfamethoxazole-trimethoprim (BACTRIM DS,SEPTRA DS) 800-160 MG tablet Take 1 tablet by mouth 2 (two) times daily.  . [DISCONTINUED] clindamycin (CLINDAGEL) 1 % gel Apply topically 2 (two) times daily.    Allergies: Patient is allergic to aspirin. Family History: Patient family history includes Hyperlipidemia in his father and mother; Thyroid disease in his mother and sister. Social History:  Patient  reports that he has never smoked. He has never used smokeless tobacco. He reports that he drinks alcohol. He reports that he does not use drugs.  Review of Systems: Constitutional: Negative for fever malaise or anorexia Cardiovascular: negative for chest pain Respiratory: negative for SOB or persistent cough Gastrointestinal: negative for abdominal pain  Objective  Vitals: BP 116/82   Pulse 64   Temp 98.4 F (36.9 C)   Ht 5\' 10"  (1.778 m)  Wt 210 lb (95.3 kg)   SpO2 98%   BMI 30.13 kg/m  General: no acute distress , A&Ox3 Skin:  Warm, no rashes    Commons side effects, risks, benefits, and alternatives for medications and treatment plan prescribed today were discussed, and the patient expressed understanding of the given instructions. Patient is instructed to call or message via MyChart if he/she has any questions or concerns regarding our treatment plan. No barriers to  understanding were identified. We discussed Red Flag symptoms and signs in detail. Patient expressed understanding regarding what to do in case of urgent or emergency type symptoms.   Medication list was reconciled, printed and provided to the patient in AVS. Patient instructions and summary information was reviewed with the patient as documented in the AVS. This note was prepared with assistance of Dragon voice recognition software. Occasional wrong-word or sound-a-like substitutions may have occurred due to the inherent limitations of voice recognition software

## 2018-03-21 ENCOUNTER — Encounter: Payer: Self-pay | Admitting: Family Medicine

## 2018-03-30 DIAGNOSIS — L739 Follicular disorder, unspecified: Secondary | ICD-10-CM | POA: Diagnosis not present

## 2018-03-30 DIAGNOSIS — L723 Sebaceous cyst: Secondary | ICD-10-CM | POA: Diagnosis not present

## 2018-05-01 ENCOUNTER — Other Ambulatory Visit: Payer: Self-pay | Admitting: Family Medicine

## 2018-05-01 NOTE — Telephone Encounter (Signed)
Copied from [place] [id number]. Topic: Quick Communication - Rx Refill/Question >> May 01, 2018 12:38 PM Leward Quan A wrote: Medication: lisdexamfetamine (VYVANSE) 20 MG capsule  Patient states that he asked Dr through [facility]or[facility] Ping can he get the 20mg  instead of 30 mg  Has the patient contacted their pharmacy? Yes.   (Agent: If no, request that the patient contact the pharmacy for the refill.) (Agent: If yes, when and what did the pharmacy advise?)  Preferred Pharmacy (with phone number or street name): [id number] - [place], [place] [phone] (Phone) [phone] (Fax)    Agent: Please be advised that RX refills may take up to 3 business days. We ask that you follow-up with your pharmacy.

## 2018-05-02 MED ORDER — LISDEXAMFETAMINE DIMESYLATE 20 MG PO CAPS: 20.0000 mg | ORAL_CAPSULE | Freq: Every day | ORAL | 0 refills | Status: DC

## 2018-05-02 NOTE — Telephone Encounter (Signed)
Pt states he asked for the [facility] but [facility] and he is wanting to see if this can be transferred to the correct pharmacy so that he may pick up today? Please advise. And pt requesting a call so that he knows it has been transferred.

## 2018-05-02 NOTE — Telephone Encounter (Signed)
Patient is requesting prescription to be sent to a different pharmacy.

## 2018-05-02 NOTE — Telephone Encounter (Signed)
Last OV: 02/08/2018 Last Fill: 02/08/2018, #30 with 0 RF   Has appt scheduled 05/10/2018

## 2018-05-02 NOTE — Addendum Note (Signed)
Addended bySigurd Sos on: 05/02/2018 03:41 PM   Modules accepted: Orders

## 2018-05-10 ENCOUNTER — Encounter: Payer: Self-pay | Admitting: Family Medicine

## 2018-05-10 ENCOUNTER — Ambulatory Visit (INDEPENDENT_AMBULATORY_CARE_PROVIDER_SITE_OTHER): Payer: 59 | Admitting: Family Medicine

## 2018-05-10 VITALS — BP 120/78 | HR 55 | Temp 97.8°F | Resp 16 | Ht 70.0 in | Wt 210.0 lb

## 2018-05-10 DIAGNOSIS — L739 Follicular disorder, unspecified: Secondary | ICD-10-CM

## 2018-05-10 DIAGNOSIS — E039 Hypothyroidism, unspecified: Secondary | ICD-10-CM

## 2018-05-10 DIAGNOSIS — Z23 Encounter for immunization: Secondary | ICD-10-CM

## 2018-05-10 DIAGNOSIS — Z0000 Encounter for general adult medical examination without abnormal findings: Secondary | ICD-10-CM

## 2018-05-10 DIAGNOSIS — K219 Gastro-esophageal reflux disease without esophagitis: Secondary | ICD-10-CM | POA: Insufficient documentation

## 2018-05-10 DIAGNOSIS — N529 Male erectile dysfunction, unspecified: Secondary | ICD-10-CM

## 2018-05-10 DIAGNOSIS — F988 Other specified behavioral and emotional disorders with onset usually occurring in childhood and adolescence: Secondary | ICD-10-CM

## 2018-05-10 DIAGNOSIS — E038 Other specified hypothyroidism: Secondary | ICD-10-CM

## 2018-05-10 LAB — COMPREHENSIVE METABOLIC PANEL
ALBUMIN: 4.6 g/dL (ref 3.5–5.2)
ALK PHOS: 60 U/L (ref 39–117)
ALT: 31 U/L (ref 0–53)
AST: 21 U/L (ref 0–37)
BILIRUBIN TOTAL: 0.7 mg/dL (ref 0.2–1.2)
BUN: 12 mg/dL (ref 6–23)
CALCIUM: 9.7 mg/dL (ref 8.4–10.5)
CO2: 28 mEq/L (ref 19–32)
Chloride: 104 mEq/L (ref 96–112)
Creatinine, Ser: 0.99 mg/dL (ref 0.40–1.50)
GFR: 86.1 mL/min (ref >60.00)
Glucose, Bld: 96 mg/dL (ref 70–99)
Potassium: 4.9 mEq/L (ref 3.5–5.1)
Sodium: 140 mEq/L (ref 135–145)
TOTAL PROTEIN: 7 g/dL (ref 6.0–8.3)

## 2018-05-10 LAB — TSH: TSH: 4.34 u[IU]/mL (ref 0.35–4.50)

## 2018-05-10 LAB — CBC WITH DIFFERENTIAL/PLATELET
BASOS ABS: 0 10*3/uL (ref 0.0–0.1)
Basophils Relative: 0.5 % (ref 0.0–3.0)
Eosinophils Absolute: 0 10*3/uL (ref 0.0–0.7)
Eosinophils Relative: 0.8 % (ref 0.0–5.0)
HEMATOCRIT: 44.6 % (ref 39.0–52.0)
HEMOGLOBIN: 15.3 g/dL (ref 13.0–17.0)
LYMPHS PCT: 36.1 % (ref 12.0–46.0)
Lymphs Abs: 1.5 10*3/uL (ref 0.7–4.0)
MCHC: 34.3 g/dL (ref 30.0–36.0)
MCV: 91.8 fl (ref 78.0–100.0)
MONOS PCT: 9 % (ref 3.0–12.0)
Monocytes Absolute: 0.4 10*3/uL (ref 0.1–1.0)
NEUTROS ABS: 2.2 10*3/uL (ref 1.4–7.7)
Neutrophils Relative %: 53.6 % (ref 43.0–77.0)
PLATELETS: 247 10*3/uL (ref 150.0–400.0)
RBC: 4.86 Mil/uL (ref 4.22–5.81)
RDW: 12.9 % (ref 11.5–15.5)
WBC: 4.1 10*3/uL (ref 4.0–10.5)

## 2018-05-10 LAB — LIPID PANEL
CHOLESTEROL: 228 mg/dL — AB (ref 0–200)
HDL: 52.7 mg/dL (ref >39.00)
LDL Cholesterol: 158 mg/dL — ABNORMAL HIGH (ref 0–99)
NonHDL: 175.68
TRIGLYCERIDES: 89 mg/dL (ref 0.0–149.0)
Total CHOL/HDL Ratio: 4
VLDL: 17.8 mg/dL (ref 0.0–40.0)

## 2018-05-10 MED ORDER — LISDEXAMFETAMINE DIMESYLATE 20 MG PO CAPS: 20.0000 mg | ORAL_CAPSULE | Freq: Every day | ORAL | 0 refills | Status: DC
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
## 2018-05-10 NOTE — Patient Instructions (Signed)
Please return in 3 months for ADD follow up.   I will release your lab results to you on your MyChart account with further instructions. Please reply with any questions.   Today you were given your influenza vaccination.    If you have any questions or concerns, please don't hesitate to send me a message via MyChart or call the office at [phone]. Thank you for visiting with [place] today! It's our pleasure caring for you.  Please do these things to maintain good health!   Exercise at least 30-45 minutes a day,  4-5 days a week.   Eat a low-fat diet with lots of fruits and vegetables, up to 7-9 servings per day.  Drink plenty of water daily. Try to drink 8 8oz glasses per day.  Seatbelts can save your life. Always wear your seatbelt.  Place Smoke Detectors on every level of your home and check batteries every year.  Eye Doctor - have an eye exam every 1-2 years  Safe sex - use condoms to protect yourself from STDs if you could be exposed to these types of infections.  Avoid heavy alcohol use. If you drink, keep it to less than 2 drinks/day and not every day.  [place].  Choose someone you trust that could speak for you if you became unable to speak for yourself.  Depression is common in our stressful world.If you're feeling down or losing interest in things you normally enjoy, please come in for a visit.

## 2018-05-11 LAB — HIV ANTIBODY (ROUTINE TESTING W REFLEX): HIV: NONREACTIVE

## 2018-08-16 ENCOUNTER — Ambulatory Visit: Payer: 59 | Admitting: Family Medicine

## 2018-08-23 ENCOUNTER — Ambulatory Visit (INDEPENDENT_AMBULATORY_CARE_PROVIDER_SITE_OTHER): Payer: 59 | Admitting: Family Medicine

## 2018-08-23 ENCOUNTER — Other Ambulatory Visit: Payer: Self-pay

## 2018-08-23 ENCOUNTER — Encounter: Payer: Self-pay | Admitting: Family Medicine

## 2018-08-23 DIAGNOSIS — F988 Other specified behavioral and emotional disorders with onset usually occurring in childhood and adolescence: Secondary | ICD-10-CM

## 2018-08-23 DIAGNOSIS — K219 Gastro-esophageal reflux disease without esophagitis: Secondary | ICD-10-CM

## 2018-08-23 MED ORDER — LISDEXAMFETAMINE DIMESYLATE 20 MG PO CAPS: 20.0000 mg | ORAL_CAPSULE | Freq: Every day | ORAL | 0 refills | Status: DC

## 2018-08-23 NOTE — Patient Instructions (Signed)
Please return in 3 months for ADD recheck.   If you have any questions or concerns, please don't hesitate to send me a message via MyChart or call the office at [phone]. Thank you for visiting with [place] today! It's our pleasure caring for you.

## 2018-08-23 NOTE — Progress Notes (Signed)
     Virtual Visit via Video Note  Subjective  CC:  Chief Complaint  Patient presents with  . ADHD  . Gastroesophageal Reflux    HPI:  I connected with Jeremy Atkins. on 08/23/18 at 10:30 AM EDT by a video enabled telemedicine application and verified that I am speaking with the correct person using two identifiers. Location patient: Home Location provider: [facility], Office Persons participating in the virtual visit: Jeremy Consalvo Jr., Leamon Arnt, MD Lilli Light, [place] discussed the limitations of evaluation and management by telemedicine and the availability of in person appointments. The patient expressed understanding and agreed to proceed. Patient is here today for follow up of ADD/ADHD. Doing very well on vyvanse 20mg  daily. No longer having any headaches or side effects.  He is taking medication as directed and continues to feel it is beneficial. The medications continue to help with focus and attention and task completion. He denies adverse side effects; specifically no headaches, appetite suppression, weight loss, sleeping difficulty, heart palpitations, chest pain or significant weight changes.  This patient does not have contraindications for stimulant use include hypertension, tachycardia, arrhythmia, psychosis, bipolar disorder, severe anorexia, and Tourette syndrome.  GERD: now resolved. Completed about 4 weeks of ppi. Now doing intermittent fasting and no longer having any sxs.   Assessment  1. Attention deficit disorder (ADD) in adult   2. Subclinical hypothyroidism   3. Gastroesophageal reflux disease without esophagitis      Plan   ADD:  Well controlled. Refilled meds x 30 days. Will refill by mychart renewal request for next 2 months.   GERD is resolved.   I discussed the assessment and treatment plan with the patient. The patient was provided an opportunity to ask questions and all were answered. The patient agreed with the  plan and demonstrated an understanding of the instructions.   The patient was advised to call back or seek an in-person evaluation if the symptoms worsen or if the condition fails to improve as anticipated. Follow up: 3 months for ADD f/u.  Visit date not found  No orders of the defined types were placed in this encounter.     I reviewed the patients updated PMH, FH, and SocHx.    Patient Active Problem List   Diagnosis Date Noted  . GERD (gastroesophageal reflux disease) 05/10/2018  . Erectile dysfunction 11/08/2017  . Subclinical hypothyroidism 10/07/2017  . Chronic folliculitis 51/88/4166  . Chronic tension-type headache, not intractable 10/07/2017  . Attention deficit disorder (ADD) in adult 05/13/2014   No outpatient medications have been marked as taking for the 08/23/18 encounter (Office Visit) with Leamon Arnt, MD.    Allergies: Patient is allergic to aspirin. Family History: Patient family history includes Hyperlipidemia in his father and mother; Thyroid disease in his mother and sister. Social History:  Patient  reports that he has never smoked. He has never used smokeless tobacco. He reports current alcohol use. He reports that he does not use drugs.  OBJECTIVE Vitals: There were no vitals taken for this visit. General: no acute distress , A&Ox3  Reviewed PDMP report.  Leamon Arnt, MD

## 2018-09-25 ENCOUNTER — Ambulatory Visit: Payer: 59 | Admitting: Family Medicine

## 2018-11-22 ENCOUNTER — Ambulatory Visit: Payer: 59 | Admitting: Family Medicine

## 2018-12-04 ENCOUNTER — Ambulatory Visit: Payer: 59 | Admitting: Family Medicine

## 2018-12-07 ENCOUNTER — Encounter: Payer: Self-pay | Admitting: Family Medicine

## 2018-12-07 ENCOUNTER — Ambulatory Visit (INDEPENDENT_AMBULATORY_CARE_PROVIDER_SITE_OTHER): Payer: 59 | Admitting: Family Medicine

## 2018-12-07 DIAGNOSIS — F988 Other specified behavioral and emotional disorders with onset usually occurring in childhood and adolescence: Secondary | ICD-10-CM

## 2018-12-07 NOTE — Progress Notes (Signed)
Virtual Visit via Video Note  Subjective  CC:  Chief Complaint  Patient presents with  . ADD    He reports he stopped taking a few weeks ago. He is trying to manage without medication and reports so far doing well.     I connected with [name] on 12/07/18 at 11:00 AM EDT by a video enabled telemedicine application and verified that I am speaking with the correct person using two identifiers. Location patient: Home Location provider: [facility] [place], Office Persons participating in the virtual visit: Jeremy Mcchesney Jr., Jeremy Arnt, MD Jeremy Atkins, [place] discussed the limitations of evaluation and management by telemedicine and the availability of in person appointments. The patient expressed understanding and agreed to proceed. HPI: Jeremy Ebel. is a 48 y.o. male who was contacted today to address the problems listed above in the chief complaint.  follow up of ADD/ADHD. He is doing well.  A few weeks ago he weaned himself off of his ADD medicines.  He feels that they work well, however his job duties have changed, he is now back in the field more often and has less need for help with attention and focus since he is having to do less computer work.  He also feels that it may have contributed to mild increased moodiness or irritability.  He denies any other side effects.  20 mg dose of Vyvanse was working well for him for the last 3 to 6 months.  He has no other concerns at this time This patient does not have contraindications for stimulant use include hypertension, tachycardia, arrhythmia, psychosis, bipolar disorder, severe anorexia, and Tourette syndrome.  Assessment  1. Attention deficit disorder (ADD) in adult      Plan   ADD: Counseling done.  Has been well controlled, now trial off of medications is proceeding well.  He denies adverse effects.  Agree with change.  He will monitor for worsening symptoms in the future.   Discussed that he could use the Vyvanse on an as-needed basis depending upon his job responsibilities.  He will let me know if he needs to restart.  Otherwise follow-up in December for complete physical I discussed the assessment and treatment plan with the patient. The patient was provided an opportunity to ask questions and all were answered. The patient agreed with the plan and demonstrated an understanding of the instructions.   The patient was advised to call back or seek an in-person evaluation if the symptoms worsen or if the condition fails to improve as anticipated. Follow up: Return in about 5 months (around 05/09/2019) for complete physical.  Visit date not found  No orders of the defined types were placed in this encounter.     I reviewed the patients updated PMH, FH, and SocHx.    Patient Active Problem List   Diagnosis Date Noted  . GERD (gastroesophageal reflux disease) 05/10/2018  . Erectile dysfunction 11/08/2017  . Subclinical hypothyroidism 10/07/2017  . Chronic folliculitis 86/[birth year]1950  . Chronic tension-type headache, not intractable 10/07/2017  . Attention deficit disorder (ADD) in adult 05/13/2014   Current Meds  Medication Sig  . Acetaminophen-Caffeine (EXCEDRIN TENSION HEADACHE PO) Take 1 tablet by mouth daily as needed (headaches).   . sildenafil (VIAGRA) 100 MG tablet TAKE ONE TABLET BY MOUTH DAILY AS NEEDED FOR ERECTILE DYSFUNCTION  . sulfamethoxazole-trimethoprim (BACTRIM DS,SEPTRA DS) 800-160 MG tablet Take 1 tablet by mouth 2 (two) times daily.  Allergies: Patient is allergic to aspirin. Family History: Patient family history includes Hyperlipidemia in his father and mother; Thyroid disease in his mother and sister. Social History:  Patient  reports that he has never smoked. He has never used smokeless tobacco. He reports current alcohol use. He reports that he does not use drugs.  Review of Systems: Constitutional: Negative for fever malaise or  anorexia Cardiovascular: negative for chest pain Respiratory: negative for SOB or persistent cough Gastrointestinal: negative for abdominal pain  OBJECTIVE Vitals: There were no vitals taken for this visit. General: no acute distress , A&Ox3  Jeremy Arnt, MD

## 2018-12-07 NOTE — Patient Instructions (Signed)
Please return in December 2020 for your annual complete physical; please come fasting.   If you have any questions or concerns, please don't hesitate to send me a message via MyChart or call the office at [phone]. Thank you for visiting with [place] today! It's our pleasure caring for you.

## 2019-04-12 ENCOUNTER — Encounter: Payer: Self-pay | Admitting: Family Medicine

## 2019-04-12 ENCOUNTER — Other Ambulatory Visit: Payer: Self-pay

## 2019-04-12 ENCOUNTER — Ambulatory Visit (INDEPENDENT_AMBULATORY_CARE_PROVIDER_SITE_OTHER): Payer: 59

## 2019-04-12 DIAGNOSIS — Z23 Encounter for immunization: Secondary | ICD-10-CM | POA: Diagnosis not present

## 2019-05-09 ENCOUNTER — Other Ambulatory Visit: Payer: Self-pay | Admitting: Family Medicine

## 2019-05-14 ENCOUNTER — Other Ambulatory Visit: Payer: Self-pay

## 2019-05-14 ENCOUNTER — Ambulatory Visit (INDEPENDENT_AMBULATORY_CARE_PROVIDER_SITE_OTHER): Payer: 59 | Admitting: Family Medicine

## 2019-05-14 ENCOUNTER — Encounter: Payer: Self-pay | Admitting: Family Medicine

## 2019-05-14 VITALS — BP 118/78 | HR 60 | Temp 98.4°F | Ht 70.0 in | Wt 222.0 lb

## 2019-05-14 DIAGNOSIS — E039 Hypothyroidism, unspecified: Secondary | ICD-10-CM

## 2019-05-14 DIAGNOSIS — N529 Male erectile dysfunction, unspecified: Secondary | ICD-10-CM

## 2019-05-14 DIAGNOSIS — L739 Follicular disorder, unspecified: Secondary | ICD-10-CM | POA: Diagnosis not present

## 2019-05-14 DIAGNOSIS — F988 Other specified behavioral and emotional disorders with onset usually occurring in childhood and adolescence: Secondary | ICD-10-CM

## 2019-05-14 DIAGNOSIS — Z0000 Encounter for general adult medical examination without abnormal findings: Secondary | ICD-10-CM

## 2019-05-14 DIAGNOSIS — E038 Other specified hypothyroidism: Secondary | ICD-10-CM

## 2019-05-14 DIAGNOSIS — Z8349 Family history of other endocrine, nutritional and metabolic diseases: Secondary | ICD-10-CM | POA: Diagnosis not present

## 2019-05-14 LAB — CBC WITH DIFFERENTIAL/PLATELET
Basophils Absolute: 0 K/uL (ref 0.0–0.1)
Basophils Relative: 0.6 % (ref 0.0–3.0)
Eosinophils Absolute: 0 K/uL (ref 0.0–0.7)
Eosinophils Relative: 0.5 % (ref 0.0–5.0)
HCT: 46.2 % (ref 39.0–52.0)
Hemoglobin: 15.7 g/dL (ref 13.0–17.0)
Lymphocytes Relative: 39.6 % (ref 12.0–46.0)
Lymphs Abs: 1.9 K/uL (ref 0.7–4.0)
MCHC: 34 g/dL (ref 30.0–36.0)
MCV: 92.2 fl (ref 78.0–100.0)
Monocytes Absolute: 0.4 K/uL (ref 0.1–1.0)
Monocytes Relative: 8.9 % (ref 3.0–12.0)
Neutro Abs: 2.4 K/uL (ref 1.4–7.7)
Neutrophils Relative %: 50.4 % (ref 43.0–77.0)
Platelets: 233 K/uL (ref 150.0–400.0)
RBC: 5.01 Mil/uL (ref 4.22–5.81)
RDW: 12.8 % (ref 11.5–15.5)
WBC: 4.7 K/uL (ref 4.0–10.5)

## 2019-05-14 LAB — COMPREHENSIVE METABOLIC PANEL
ALT: 42 U/L (ref 0–53)
AST: 25 U/L (ref 0–37)
Albumin: 4.8 g/dL (ref 3.5–5.2)
Alkaline Phosphatase: 58 U/L (ref 39–117)
BUN: 12 mg/dL (ref 6–23)
CO2: 26 mEq/L (ref 19–32)
Calcium: 9.7 mg/dL (ref 8.4–10.5)
Chloride: 104 mEq/L (ref 96–112)
Creatinine, Ser: 0.86 mg/dL (ref 0.40–1.50)
GFR: 94.88 mL/min (ref >60.00)
Glucose, Bld: 89 mg/dL (ref 70–99)
Potassium: 4.4 mEq/L (ref 3.5–5.1)
Sodium: 140 mEq/L (ref 135–145)
Total Bilirubin: 0.6 mg/dL (ref 0.2–1.2)
Total Protein: 7.1 g/dL (ref 6.0–8.3)

## 2019-05-14 LAB — TSH: TSH: 5.26 u[IU]/mL — ABNORMAL HIGH (ref 0.35–4.50)

## 2019-05-14 LAB — LIPID PANEL
Cholesterol: 242 mg/dL — ABNORMAL HIGH (ref 0–200)
HDL: 48.8 mg/dL (ref >39.00)
LDL Cholesterol: 169 mg/dL — ABNORMAL HIGH (ref 0–99)
NonHDL: 193.16
Total CHOL/HDL Ratio: 5
Triglycerides: 122 mg/dL (ref 0.0–149.0)
VLDL: 24.4 mg/dL (ref 0.0–40.0)
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
## 2019-05-14 NOTE — Patient Instructions (Signed)
Please return in 12 months for your annual complete physical; please come fasting.   I will release your lab results to you on your MyChart account with further instructions. Please reply with any questions.    If you have any questions or concerns, please don't hesitate to send me a message via MyChart or call the office at [phone]. Thank you for visiting with us today! It's our pleasure caring for you.   Please do these things to maintain good health!  Exercise at least 30-45 minutes a day,  4-5 days a week.  Eat a low-fat diet with lots of fruits and vegetables, up to 7-9 servings per day. Drink plenty of water daily. Try to drink 8 8oz glasses per day. Seatbelts can save your life. Always wear your seatbelt. Place Smoke Detectors on every level of your home and check batteries every year. Eye Doctor - have an eye exam every 1-2 years Safe sex - use condoms to protect yourself from STDs if you could be exposed to these types of infections. Avoid heavy alcohol use. If you drink, keep it to less than 2 drinks/day and not every day. Health Care Power of Attorney.  Choose someone you trust that could speak for you if you became unable to speak for yourself. Depression is common in our stressful world.If you're feeling down or losing interest in things you normally enjoy, please come in for a visit.  

## 2019-05-15 LAB — TESTOSTERONE TOTAL,FREE,BIO, MALES
Albumin: 4.9 g/dL (ref 3.6–5.1)
Sex Hormone Binding: 39 nmol/L (ref 10–50)
Testosterone, Bioavailable: 149.3 ng/dL (ref >=110.0)
Testosterone, Free: 66.9 pg/mL (ref 46.0–224.0)
Testosterone: 573 ng/dL (ref 250–827)

## 2019-05-15 NOTE — Progress Notes (Signed)
Please add on T3 and Free T4, dx: subclinical hypothyroidism Thanks, Dr. Jonni Sanger '

## 2019-05-15 NOTE — Addendum Note (Signed)
Addended by: Francis Dowse T on: 05/15/2019 04:18 PM   Modules accepted: Orders

## 2019-05-15 NOTE — Progress Notes (Signed)
Ptr needs to come back those test go to a different lab. I put in orders aleady and will call pt to return.

## 2019-05-16 ENCOUNTER — Telehealth: Payer: Self-pay | Admitting: Family Medicine

## 2019-05-16 NOTE — Telephone Encounter (Signed)
Pt returned call to schedule an appt for labs. Pt stated that he had labs drawn after his physical so he is not sure why he is being asked to schedule an appt for more labs. Pt requests call back

## 2019-05-16 NOTE — Telephone Encounter (Signed)
LVM patient needs to schedule an appointment for labs. Orders already placed.

## 2019-05-17 NOTE — Telephone Encounter (Signed)
See below

## 2019-05-17 NOTE — Telephone Encounter (Signed)
Returned patient's call. Patient notified that Dr. Jonni Sanger needed additional labs. Patient put me on hold for 6 mins.

## 2019-07-31 ENCOUNTER — Encounter: Payer: Self-pay | Admitting: Family Medicine

## 2019-08-01 ENCOUNTER — Other Ambulatory Visit: Payer: Self-pay

## 2019-08-01 DIAGNOSIS — Z3009 Encounter for other general counseling and advice on contraception: Secondary | ICD-10-CM

## 2019-08-01 NOTE — Telephone Encounter (Signed)
Please refer to [facility] for vasectomy.  Notify pt.  Thanks.

## 2020-05-20 ENCOUNTER — Encounter: Payer: Self-pay | Admitting: Family Medicine

## 2020-05-20 ENCOUNTER — Ambulatory Visit (INDEPENDENT_AMBULATORY_CARE_PROVIDER_SITE_OTHER): Payer: 59 | Admitting: Family Medicine

## 2020-05-20 ENCOUNTER — Other Ambulatory Visit: Payer: Self-pay

## 2020-05-20 VITALS — BP 122/80 | HR 59 | Temp 98.1°F | Resp 18 | Ht 70.0 in | Wt 213.4 lb

## 2020-05-20 DIAGNOSIS — Z1159 Encounter for screening for other viral diseases: Secondary | ICD-10-CM | POA: Diagnosis not present

## 2020-05-20 DIAGNOSIS — E038 Other specified hypothyroidism: Secondary | ICD-10-CM

## 2020-05-20 DIAGNOSIS — Z23 Encounter for immunization: Secondary | ICD-10-CM

## 2020-05-20 DIAGNOSIS — Z0000 Encounter for general adult medical examination without abnormal findings: Secondary | ICD-10-CM

## 2020-05-20 DIAGNOSIS — L739 Follicular disorder, unspecified: Secondary | ICD-10-CM | POA: Diagnosis not present

## 2020-05-20 DIAGNOSIS — F988 Other specified behavioral and emotional disorders with onset usually occurring in childhood and adolescence: Secondary | ICD-10-CM

## 2020-05-20 LAB — CBC WITH DIFFERENTIAL/PLATELET
Basophils Absolute: 0 10*3/uL (ref 0.0–0.1)
Basophils Relative: 0.6 % (ref 0.0–3.0)
Eosinophils Absolute: 0 10*3/uL (ref 0.0–0.7)
Eosinophils Relative: 0.6 % (ref 0.0–5.0)
HCT: 43.6 % (ref 39.0–52.0)
Hemoglobin: 15.1 g/dL (ref 13.0–17.0)
Lymphocytes Relative: 31 % (ref 12.0–46.0)
Lymphs Abs: 1.6 10*3/uL (ref 0.7–4.0)
MCHC: 34.5 g/dL (ref 30.0–36.0)
MCV: 91.1 fl (ref 78.0–100.0)
Monocytes Absolute: 0.4 10*3/uL (ref 0.1–1.0)
Monocytes Relative: 8.1 % (ref 3.0–12.0)
Neutro Abs: 3.1 10*3/uL (ref 1.4–7.7)
Neutrophils Relative %: 59.7 % (ref 43.0–77.0)
Platelets: 238 10*3/uL (ref 150.0–400.0)
RBC: 4.79 Mil/uL (ref 4.22–5.81)
RDW: 12.3 % (ref 11.5–15.5)
WBC: 5.1 10*3/uL (ref 4.0–10.5)

## 2020-05-20 LAB — COMPREHENSIVE METABOLIC PANEL
ALT: 49 U/L (ref 0–53)
AST: 30 U/L (ref 0–37)
Albumin: 4.5 g/dL (ref 3.5–5.2)
Alkaline Phosphatase: 60 U/L (ref 39–117)
BUN: 13 mg/dL (ref 6–23)
CO2: 25 mEq/L (ref 19–32)
Calcium: 9.1 mg/dL (ref 8.4–10.5)
Chloride: 106 mEq/L (ref 96–112)
Creatinine, Ser: 0.82 mg/dL (ref 0.40–1.50)
GFR: 103.45 mL/min (ref >60.00)
Glucose, Bld: 95 mg/dL (ref 70–99)
Potassium: 3.7 mEq/L (ref 3.5–5.1)
Sodium: 139 mEq/L (ref 135–145)
Total Bilirubin: 0.8 mg/dL (ref 0.2–1.2)
Total Protein: 7.1 g/dL (ref 6.0–8.3)

## 2020-05-20 LAB — T4, FREE: Free T4: 0.71 ng/dL (ref 0.60–1.60)

## 2020-05-20 LAB — LIPID PANEL
Cholesterol: 211 mg/dL — ABNORMAL HIGH (ref 0–200)
HDL: 43.6 mg/dL (ref >39.00)
LDL Cholesterol: 147 mg/dL — ABNORMAL HIGH (ref 0–99)
NonHDL: 167.88
Total CHOL/HDL Ratio: 5
Triglycerides: 103 mg/dL (ref 0.0–149.0)
VLDL: 20.6 mg/dL (ref 0.0–40.0)

## 2020-05-20 LAB — TSH: TSH: 3.58 u[IU]/mL (ref 0.35–4.50)
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
## 2020-05-20 NOTE — Patient Instructions (Addendum)
Please return in 12 months for your annual complete physical; please come fasting.  I will release your lab results to you on your MyChart account with further instructions. Please reply with any questions.   If you have any questions or concerns, please don't hesitate to send me a message via MyChart or call the office at [phone]. Thank you for visiting with [place] today! It's our pleasure caring for you.  Today you were given your flu vaccination.   You are eligible for colon cancer screening: colonoscopy or cologuard as we discussed. Let me know if you'd like me to get that scheduled or ordered for you. You can check with your insurance company to see if both are covered prior to age 9.

## 2020-05-21 LAB — HEPATITIS C ANTIBODY
Hepatitis C Ab: NONREACTIVE
SIGNAL TO CUT-OFF: 0.01 (ref <1.00)

## 2020-05-21 LAB — T3: T3, Total: 123 ng/dL (ref 76–181)

## 2020-12-05 ENCOUNTER — Emergency Department
Admission: EM | Admit: 2020-12-05 | Discharge: 2020-12-05 | Disposition: A | Discharge status: Home / Self Care | Payer: 59 | Source: Home / Self Care

## 2020-12-05 ENCOUNTER — Emergency Department (INDEPENDENT_AMBULATORY_CARE_PROVIDER_SITE_OTHER): Payer: 59

## 2020-12-05 ENCOUNTER — Other Ambulatory Visit: Payer: Self-pay

## 2020-12-05 DIAGNOSIS — T148XXA Other injury of unspecified body region, initial encounter: Secondary | ICD-10-CM

## 2020-12-05 DIAGNOSIS — M79662 Pain in left lower leg: Secondary | ICD-10-CM | POA: Diagnosis not present

## 2020-12-05 DIAGNOSIS — M7989 Other specified soft tissue disorders: Secondary | ICD-10-CM

## 2020-12-05 DIAGNOSIS — M79605 Pain in left leg: Secondary | ICD-10-CM

## 2020-12-05 MED ORDER — BACLOFEN 10 MG PO TABS: 10.0000 mg | ORAL_TABLET | Freq: Three times a day (TID) | ORAL | 0 refills | Status: AC
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
## 2020-12-05 NOTE — ED Triage Notes (Signed)
Pt c/o LT leg pain/cramps since last Saturday. Pain is intermittent. Worsening this morning. Pain radiates up from LT calf. Elevating and icing prn. Pain 5/10

## 2020-12-05 NOTE — Discharge Instructions (Addendum)
Advised/instructed patient may use Baclofen daily, as needed for left calf strain.  Advised also may use OTC analgesics daily, as needed.  Encouraged patient to avoid moderately strenuous, repetitive, overuse activities that would involve left calf for the next 3-5 days.

## 2021-05-21 ENCOUNTER — Encounter: Payer: Self-pay | Admitting: Family Medicine

## 2021-05-27 ENCOUNTER — Encounter: Payer: 59 | Admitting: Family Medicine

## 2021-06-02 ENCOUNTER — Encounter: Payer: 59 | Admitting: Family Medicine

## 2022-02-15 ENCOUNTER — Encounter: Payer: Self-pay | Admitting: *Deleted

## 2022-05-06 ENCOUNTER — Encounter: Payer: Self-pay | Admitting: *Deleted
